# Patient Record
Sex: Male | Born: 1960 | Race: White | Hispanic: No | Smoking: Former smoker
Health system: Southern US, Community
[De-identification: ages and names within clinical notes are randomized; demographics above are authoritative.]

## PROBLEM LIST (undated history)

## (undated) DIAGNOSIS — I209 Angina pectoris, unspecified: Secondary | ICD-10-CM

## (undated) DIAGNOSIS — Z87442 Personal history of urinary calculi: Secondary | ICD-10-CM

## (undated) DIAGNOSIS — I10 Essential (primary) hypertension: Secondary | ICD-10-CM

## (undated) HISTORY — PX: CERVICAL DISC SURGERY: SHX588

## (undated) HISTORY — PX: DG GALL BLADDER: HXRAD326

## (undated) HISTORY — DX: Essential (primary) hypertension: I10

## (undated) HISTORY — PX: NECK SURGERY: SHX720

---

## 2006-09-28 HISTORY — PX: NECK SURGERY: SHX720

## 2009-09-04 ENCOUNTER — Emergency Department (HOSPITAL_COMMUNITY): Admission: EM | Admit: 2009-09-04 | Discharge: 2009-09-04 | Payer: Self-pay | Admitting: Family Medicine

## 2010-12-08 ENCOUNTER — Observation Stay (HOSPITAL_COMMUNITY)
Admission: EM | Admit: 2010-12-08 | Discharge: 2010-12-10 | Disposition: A | Discharge status: Home / Self Care | Payer: BC Managed Care – PPO | Attending: Internal Medicine | Admitting: Internal Medicine

## 2010-12-08 ENCOUNTER — Emergency Department (HOSPITAL_COMMUNITY): Payer: BC Managed Care – PPO

## 2010-12-08 DIAGNOSIS — F17200 Nicotine dependence, unspecified, uncomplicated: Secondary | ICD-10-CM | POA: Insufficient documentation

## 2010-12-08 DIAGNOSIS — R0789 Other chest pain: Principal | ICD-10-CM | POA: Insufficient documentation

## 2010-12-08 LAB — CBC
HCT: 42.4 % (ref 39.0–52.0)
Hemoglobin: 14 g/dL (ref 13.0–17.0)
RBC: 4.7 MIL/uL (ref 4.22–5.81)
WBC: 10.8 10*3/uL — ABNORMAL HIGH (ref 4.0–10.5)

## 2010-12-08 LAB — DIFFERENTIAL
Basophils Absolute: 0 10*3/uL (ref 0.0–0.1)
Basophils Relative: 0 % (ref 0–1)
Lymphocytes Relative: 32 % (ref 12–46)
Neutro Abs: 6.6 10*3/uL (ref 1.7–7.7)
Neutrophils Relative %: 61 % (ref 43–77)

## 2010-12-08 LAB — BASIC METABOLIC PANEL
CO2: 27 mEq/L (ref 19–32)
Calcium: 8.9 mg/dL (ref 8.4–10.5)
Chloride: 104 mEq/L (ref 96–112)
GFR calc Af Amer: >60 mL/min (ref >60)
Glucose, Bld: 99 mg/dL (ref 70–99)
Potassium: 3.5 mEq/L (ref 3.5–5.1)
Sodium: 138 mEq/L (ref 135–145)

## 2010-12-08 LAB — TROPONIN I: Troponin I: 0.01 ng/mL (ref 0.00–0.06)

## 2010-12-08 LAB — CK TOTAL AND CKMB (NOT AT ARMC)
CK, MB: 2.1 ng/mL (ref 0.3–4.0)
Total CK: 96 U/L (ref 7–232)

## 2010-12-09 DIAGNOSIS — R079 Chest pain, unspecified: Secondary | ICD-10-CM

## 2010-12-09 LAB — CARDIAC PANEL(CRET KIN+CKTOT+MB+TROPI)
Total CK: 82 U/L (ref 7–232)
Total CK: 83 U/L (ref 7–232)
Troponin I: 0.01 ng/mL (ref 0.00–0.06)
Troponin I: 0.01 ng/mL (ref 0.00–0.06)

## 2010-12-09 LAB — BASIC METABOLIC PANEL
Calcium: 8.9 mg/dL (ref 8.4–10.5)
GFR calc Af Amer: >60 mL/min (ref >60)
GFR calc non Af Amer: >60 mL/min (ref >60)
Glucose, Bld: 110 mg/dL — ABNORMAL HIGH (ref 70–99)
Sodium: 139 mEq/L (ref 135–145)

## 2010-12-09 LAB — LIPID PANEL
LDL Cholesterol: 117 mg/dL — ABNORMAL HIGH (ref 0–99)
Total CHOL/HDL Ratio: 4.9 RATIO
VLDL: 14 mg/dL (ref 0–40)

## 2010-12-10 ENCOUNTER — Telehealth (INDEPENDENT_AMBULATORY_CARE_PROVIDER_SITE_OTHER): Payer: Self-pay | Admitting: *Deleted

## 2010-12-10 LAB — CBC
Hemoglobin: 14.3 g/dL (ref 13.0–17.0)
MCH: 29.7 pg (ref 26.0–34.0)
MCV: 91.7 fL (ref 78.0–100.0)
Platelets: 181 10*3/uL (ref 150–400)
RBC: 4.82 MIL/uL (ref 4.22–5.81)

## 2010-12-10 LAB — BASIC METABOLIC PANEL
BUN: 10 mg/dL (ref 6–23)
CO2: 27 mEq/L (ref 19–32)
Chloride: 107 mEq/L (ref 96–112)
Creatinine, Ser: 1.03 mg/dL (ref 0.4–1.5)

## 2010-12-10 NOTE — H&P (Signed)
NAMEKOSTA, SCHNITZLER NO.:  [id number]  MEDICAL RECORD NO.:  [id number]           PATIENT TYPE:  E  LOCATION:  WLED                         FACILITY:  [facility]  PHYSICIAN:  Vania Rea, M.D. DATE OF BIRTH:  1960/12/22  DATE OF ADMISSION:  12/08/2010 DATE OF DISCHARGE:                             HISTORY & PHYSICAL   PRIMARY CARE PHYSICIAN:  Unassigned.  CHIEF COMPLAINT:  Chest pain.  HISTORY OF PRESENT ILLNESS:  This is a 50 year old Caucasian gentleman who smokes a pack a day, but denies any other medical issues and works as a Designer, industrial/product, does a lot of walking throughout the day, but does not have regular exercise program.  The patient says he was in his baseline state of health until about 4:30 this afternoon when he had sudden onset of a sharp left-sided chest pain associated with sweating. He had no nausea or vomiting.  No lightheadedness or syncope. Eventually, the patient came to the emergency room by private vehicle arriving just after 6 p.m., received sublingual nitroglycerin and the pain went from 5/10 to 1/10.  As a result, the hospitalist service was called to assist with management.  The patient has had no orthopnea.  No lower extremity edema.  No dyspnea on exertion.  He has no family medical history of coronary artery disease.  PAST MEDICAL HISTORY:  None.  MEDICATIONS:  None.  ALLERGIES:  No known drug allergies.  SOCIAL HISTORY:  Smokes 1 pack per day since teenager.  Denies alcohol or illicit drug use.  Works as a Designer, industrial/product.  FAMILY HISTORY:  Significant for hypertension and diabetes.  REVIEW OF SYSTEMS:  Other than noted above, unremarkable.  PHYSICAL EXAMINATION:  GENERAL:  Athletically built middle-aged Caucasian gentleman reclining in the stretcher, not in any acute distress. VITAL SIGNS:  Temperature was 98.2, pulse 74, respirations 16, blood pressure 110/59.  He is saturating at 96% on room air. HEENT:  His  pupils are round and equal.  Mucous membranes pink and anicteric. NECK:  No cervical lymphadenopathy or thyromegaly.  No carotid bruit. No jugular venous distention. CHEST:  Clear to auscultation bilaterally. CARDIOVASCULAR:  Regular rhythm.  No murmurs. ABDOMEN:  Scaphoid, soft, nontender.  No masses. EXTREMITIES:  Without edema. SKIN:  Warm and dry.  No ulcerations. CENTRAL NERVOUS SYSTEM:  Cranial nerves II through XII are grossly intact.  He has no focal lateralizing signs.  LABORATORY DATA:  His white count is 10.8, hemoglobin 14.0, platelets 198.  He has a normal differential.  His sodium is 138, potassium 3.5, chloride 104, CO2 is 27, glucose 99, BUN 8, creatinine 0.89.  His calcium is 8.9.  His cardiac enzymes are completely normal with a troponin of 0.01, total CK of 96, CK-MB 2.1.  His 2-view chest x-ray shows no acute abnormalities.  His EKG shows normal sinus rhythm.  No ST- segment changes such as ischemia.  ASSESSMENT: 1. Unstable angina as evidenced by chest pain, relieved by     nitroglycerin. 2. Tobacco abuse.  PLAN: 1. We will bring this gentleman on observation to rule out an acute     coronary  syndrome.  I have offered him smoking cessation therapy     and he is willing to try it, although he said he has failed patches     previously. 2. I have counseled him on the importance of having a regular medical     physician for preventative screening exams. 3. Other plans as per orders.     Vania Rea, M.D.     LC/MEDQ  D:  12/08/2010  T:  12/08/2010  Job:  [id number]  Electronically Signed by Vania Rea M.D. on 12/10/2010 02:58:46 AM

## 2010-12-10 NOTE — Consult Note (Addendum)
Benjamin Hardy, Benjamin Hardy               ACCOUNT NO.:  [id number]  MEDICAL RECORD NO.:  [id number]           PATIENT TYPE:  I  LOCATION:  1433                         FACILITY:  [facility]  PHYSICIAN:  Madolyn Frieze. Benjamin Som, MD, FACCDATE OF BIRTH:  [birth year]  DATE OF CONSULTATION:  12/09/2010 DATE OF DISCHARGE:                                CONSULTATION   PRIMARY CARDIOLOGIST:  New.  PRIMARY MEDICAL DOCTOR:  None.  CHIEF COMPLAINT:  Chest pain.  HISTORY OF PRESENT ILLNESS:  Benjamin Hardy is a 50 year old gentleman with no significant past medical history and no prior cardiac history or workup who does have 35-year history of ongoing tobacco abuse.  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  Early on in the shift, he felt fine, without chest pain or shortness of breath.  However, at approximately 5:00 p.m. while walking down steps, he developed left- sided anterior chest discomfort which he describes as sudden and sharp pain.  He walked on a few more steps and stopped and then eased off.  He felt what he describes as "clammy headed."  He went downstairs, sat on a patio table and smoked cigarettes.  Pain overall lasted 45 minutes.  He was reluctant to come to the hospital but the owner of the company insisted that he proceed to the ER.  He was given a sublingual nitroglycerin which he states relieved the pain after about 20 minutes. He has never had any chest pain before.  He does get mild dyspnea with stairs.  He has had no chest pain or shortness of breath with admission, and denies any shortness of breath with this particular episode.  Pain is not increased with inspiration.  He denies any recent surgery but did travel recently to [place] by 6-hour car ride, therein back.  Cardiac enzymes are negative x3 and EKG shows nonspecific ST changes with a PVC and right axis deviation.  PAST MEDICAL HISTORY:  None.  PAST  SURGICAL HISTORY:  Cervical disk disease, status post plate implantation in his neck 2 years ago.  The patient denies any prior history of MI, CHF, diabetes, kidney disease, echocardiogram, cath or stress test.  MEDICATIONS:  As an outpatient, the patient is taking no medicines. Inpatient, he has been started on aspirin 81 mg daily, Lovenox 40 mg daily, Lopid 600 mg b.i.d., nitroglycerin 1 inch daily, and nicotine patch.  ALLERGIES:  No known drug allergies.  SOCIAL HISTORY:  Benjamin Hardy is married and lives with his wife.  They do not have any children.  He has smoked 1 pack per day for 35 years.  He endorses drinking 3 to 4 beers may be once a month or less.  He denies any illicit drug use.  He works for [employer] as a Designer, industrial/product. He does a lot of walking in his work with no chest pain or shortness of breath but does not do dedicated exercise regularly.  FAMILY HISTORY:  His mother is living, age 71, with no health problems. Father is living, age 55,  with no health problems.  He has 6 siblings, all of whom are in good health with no known health problems.  REVIEW OF SYSTEMS:  No fevers, chills, edema, palpitations, syncope, claudication, nausea, vomiting, melena or hematemesis.  See HPI for pertinent positives.  All other systems reviewed and otherwise negative.  LABORATORY DATA:  WBC 10.8, hemoglobin 14, hematocrit 42.4, platelet count 198.  Sodium 139, potassium 4.6, chloride 105, CO2 of 29, glucose 110, BUN 9, creatinine 1.01.  Cardiac enzymes negative x2.  Total cholesterol 165, triglycerides 70, HDL 34, LDL 117.  RADIOLOGY:  Chest x-ray, December 08, 2010, showed no acute abnormalities.  PHYSICAL EXAMINATION:  VITAL SIGNS:  Temperature 97.9, pulse 58, respirations 18, blood pressure 108/71, pulse ox 98% on room air. GENERAL:  [name], in no acute distress, laying comfortably flat in bed. HEENT:  Normocephalic, atraumatic with  extraocular movements intact. Anicteric sclerae.  Nares without discharge. NECK:  Supple without carotid bruit on auscultation. HEART:  Reveals regular rate and rhythm with S1 and S2 without murmurs, rubs or gallops. LUNGS:  Lungs have faint rhonchorous sounds on expiration but are otherwise free of wheezes or rales.  Good air movement throughout. ABDOMEN:  Soft, nontender, nondistended.  Positive bowel sounds.  No hepatosplenomegaly. EXTREMITIES:  Warm and dry without edema.  He has 2+ pedal pulses bilaterally. NEUROLOGIC:  He is alert and oriented x3.  Responds to questions appropriately with a normal affect.  ASSESSMENT AND PLAN:  The patient was seen and examined by Dr. Jens Hardy and myself.  Benjamin Hardy is a 50 year old gentleman with past medical history only pertinent for ongoing tobacco abuse for 35 years with no prior history of hypertension, diabetes, hyperlipidemia or family history of coronary artery disease.  He presents with an episode of somewhat atypical chest pain yesterday while walking stairs, located in the left upper chest without radiation, not pleuritic or positional, which he describes as a sharp and stabbing sensation.  It was associated with some diaphoresis but no shortness of breath or nausea.  Duration was 45 minutes and it resolved with nitroglycerin, however, only after 20 minutes had passed.  His enzymes are negative and his EKG is nonspecific for ST changes with right axis deviation and PVC.  At this time, we will plan an outpatient Myoview given the atypical nature of his symptoms and this will be arranged early for in 2 days and he will follow with Dr. Jens Hardy thereafter.  Of note, he does have the right axis deviation in his EKG as well as recent travel to [place], so we will go ahead and check a D- dimer prior to discharge.  If it is positive, we will plan for CT angio but if it is negative, we feel he can be discharged home.  He has also been  extensively counseled on his tobacco abuse.  Thank you for the opportunity to participate in the care of this patient.     Dayna Dunn, P.A.C.   ______________________________ Madolyn Frieze. Benjamin Som, MD, [facility]    DD/MEDQ  D:  12/09/2010  T:  12/09/2010  Job:  [id number]  cc:   Madolyn Frieze. Benjamin Som, MD, [facility] [address]  [address] [place] [address]  Electronically Signed by Ronie Spies  on 12/10/2010 10:16:05 PM Electronically Signed by Olga Millers MD [facility] on 12/14/2010 08:24:17 AM

## 2010-12-11 ENCOUNTER — Ambulatory Visit (HOSPITAL_COMMUNITY): Payer: BC Managed Care – PPO | Attending: Cardiology

## 2010-12-11 ENCOUNTER — Encounter: Payer: Self-pay | Admitting: Internal Medicine

## 2010-12-11 DIAGNOSIS — R0989 Other specified symptoms and signs involving the circulatory and respiratory systems: Secondary | ICD-10-CM

## 2010-12-11 DIAGNOSIS — R079 Chest pain, unspecified: Secondary | ICD-10-CM

## 2010-12-11 DIAGNOSIS — I491 Atrial premature depolarization: Secondary | ICD-10-CM

## 2010-12-11 DIAGNOSIS — I4949 Other premature depolarization: Secondary | ICD-10-CM

## 2010-12-16 NOTE — Progress Notes (Signed)
Summary: Nuclear Pre-Procedure  Phone Note Outgoing Call Call back at [facility] Phone [phone]   Call placed by: Stanton Kidney, EMT-P,  December 10, 2010 11:27 AM Action Taken: Phone Call Completed Summary of Call: Reviewed information on Myoview Information Sheet (see scanned document for further details).  Spoke with the patient's wife. Stanton Kidney, EMT-P  December 10, 2010 11:27 AM      Nuclear Med Background Indications for Stress Test: Evaluation for Ischemia, Post Hospital  Indications Comments: 12/10/10 CP, (-) enzymes  History: Echo  History Comments: Echo: EF=55-65%, NL  Symptoms: Chest Pain, Chest Pain with Exertion, Diaphoresis, DOE    Nuclear Pre-Procedure Cardiac Risk Factors: Smoker

## 2010-12-16 NOTE — Assessment & Plan Note (Signed)
Summary: Cardiology Nuclear Testing  Nuclear Med Background Indications for Stress Test: Evaluation for Ischemia, Post Hospital  Indications Comments: 12/10/10 CP, (-) enzymes  History: Echo  History Comments: Echo: EF=55-65%, NL  Symptoms: Chest Pain, Chest Pain with Exertion, Diaphoresis, DOE, Fatigue, Palpitations    Nuclear Pre-Procedure Cardiac Risk Factors: Smoker Caffeine/Decaff Intake: None NPO After: 4:00 PM Lungs: clear IV 0.9% NS with Angio Cath: 20g     IV Site: R Hand IV Started by: Bonnita Levan, RN Chest Size (in) 44     Height (in): 72 Weight (lb): 180 BMI: 24.50  Nuclear Med Study 1 or 2 day study:  1 day     Stress Test Type:  Stress Reading MD:  Dietrich Pates, MD     Referring MD:  B.Crenshaw Resting Radionuclide:  Technetium 87m Tetrofosmin     Resting Radionuclide Dose:  11 mCi  Stress Radionuclide:  Technetium 65m Tetrofosmin     Stress Radionuclide Dose:  33 mCi   Stress Protocol Exercise Time (min):  10:30 min     Max HR:  155 bpm     Predicted Max HR:  170 bpm  Max Systolic BP: 192 mm Hg     Percent Max HR:  91.18 %     METS: 12.6 Rate Pressure Product:  16109    Stress Test Technologist:  Milana Na, EMT-P     Nuclear Technologist:  Domenic Polite, CNMT  Rest Procedure  Myocardial perfusion imaging was performed at rest 45 minutes following the intravenous administration of Technetium 45m Tetrofosmin.  Stress Procedure  The patient exercised for 10:30. The patient stopped due to fatigue, leg pain, and denied any chest pain.  There were no significant ST-T wave changes and occ pvcs/pacs.  Technetium 44m Tetrofosmin was injected at peak exercise and myocardial perfusion imaging was performed after a brief delay.  QPS Raw Data Images:  Images were motion corrected.  Soft tissue (diaphragm) underlies heart Stress Images:  Normal homogeneous uptake in all areas of the myocardium. Rest Images:  Normal homogeneous uptake in all areas of the  myocardium. Subtraction (SDS):  No evidence of ischemia. Transient Ischemic Dilatation:  .99  (Normal <1.22)  Lung/Heart Ratio:  .31  (Normal <0.45)  Quantitative Gated Spect Images QGS EDV:  125 ml QGS ESV:  55 ml QGS EF:  56 %   Overall Impression  Exercise Capacity: Excellent exercise capacity. BP Response: Normal blood pressure response. Clinical Symptoms: No chest pain ECG Impression: No significant ST segment change suggestive of ischemia. Overall Impression: Normal stress nuclear study.  Appended Document: Cardiology Nuclear Testing ok  Appended Document: Cardiology Nuclear Testing pt aware of results

## 2010-12-23 NOTE — Discharge Summary (Signed)
  NAMEJAMERIUS, Benjamin Hardy               ACCOUNT NO.:  [id number]  MEDICAL RECORD NO.:  [id number]           PATIENT TYPE:  I  LOCATION:  1433                         [facility]ACILITY[facility]  PHYSICIAN:  Hartley Barefoot, MD    DATE OF BIRTH:  Feb 26, 1961  DATE OF ADMISSION:  12/08/2010 DATE OF DISCHARGE:  12/10/2010                              DISCHARGE SUMMARY   DISCHARGE DIAGNOSES: 1. Atypical chest pain, rule out acute coronary syndrome. 2. Past medical history of tobacco use.  DISCHARGE MEDICATIONS:  Aspirin 81 mg p.o. daily.  STUDIES PERFORMED: 1. A 2-D echo showed ejection fraction of 55-65%.  No wall motion     abnormality. 2. Chest x-ray, no acute abnormalities.  HISTORY OF PRESENT ILLNESS:  A 50 year old who presents complaining of chest pain to anterior chest, some discomfort, sudden onset, sharp in quality.  The episode lasted for 45 minutes.  The patient received sublingual nitroglycerin which he states improved his pain.  EKG showed nonspecific ST changes with PVC and right axis deviation.  CONSULTANT:  Cardiology was consulted.  HOSPITAL COURSE:  Atypical chest pain, rule out acute coronary syndrome. EKG with nonspecific ST changes and right axis deviation.  Cardiac enzymes x3 negative.  D-dimer negative, unlikely PE.  A 2-D echocardiogram, no wall motion abnormality.  The patient will have a stress test arranged by Cardiology on December 11, 2010, and 7:30 a.m. Chest pain has resolved at this time.  The patient will be discharged today.  Chest x-ray negative for pneumonia or pneumothorax.  LDL was at 117.  The patient should follow a heart healthy diet.  On the day of discharge, the patient was in improved condition.  No chest pain.  Blood pressure 138/91, sat 97% on room air, respirations 20, pulse 74, and temperature 98.1.  DISCHARGE LABS:  Sodium 141, potassium 4.2, chloride 107, bicarb 27, glucose 91, BUN 10, creatinine 1.03, white blood cell 8.3, hemoglobin 14.3, and  platelet 181,000.     Hartley Barefoot, MD     BR/MEDQ  D:  12/10/2010  T:  12/10/2010  Job:  [id number]  Electronically Signed by Hartley Barefoot MD on 12/23/2010 01:13:53 PM

## 2010-12-30 LAB — CBC
HCT: 42.2 % (ref 39.0–52.0)
Hemoglobin: 13.9 g/dL (ref 13.0–17.0)
MCHC: 32.9 g/dL (ref 30.0–36.0)
MCV: 92.4 fL (ref 78.0–100.0)
RBC: 4.57 MIL/uL (ref 4.22–5.81)

## 2010-12-30 LAB — COMPREHENSIVE METABOLIC PANEL
ALT: 14 U/L (ref 0–53)
CO2: 29 mEq/L (ref 19–32)
Calcium: 9.5 mg/dL (ref 8.4–10.5)
Creatinine, Ser: 1 mg/dL (ref 0.4–1.5)
GFR calc non Af Amer: >60 mL/min (ref >60)
Glucose, Bld: 96 mg/dL (ref 70–99)

## 2010-12-30 LAB — DIFFERENTIAL
Eosinophils Absolute: 0.2 10*3/uL (ref 0.0–0.7)
Lymphocytes Relative: 38 % (ref 12–46)
Lymphs Abs: 3.5 10*3/uL (ref 0.7–4.0)
Neutrophils Relative %: 52 % (ref 43–77)

## 2011-01-13 ENCOUNTER — Encounter: Payer: Self-pay | Admitting: Cardiology

## 2011-01-14 ENCOUNTER — Encounter: Payer: BC Managed Care – PPO | Admitting: Cardiology

## 2011-01-14 NOTE — Progress Notes (Signed)
HPI: Pleasant gentleman recently admitted to [facility] in March of 2012 with atypical chest pain. Cardiac markers and d-dimer negative. Echocardiogram showed normal LV function. There was mild left atrial enlargement. There was moderate right ventricular enlargement with decreased function. There was moderate to severe right atrial enlargement. Outpatient Myoview in March of 2012 revealed normal perfusion and an ejection fraction of 56%. Since he was discharged,   Current Outpatient Prescriptions  Medication Sig Dispense Refill  . aspirin 81 MG tablet Take 81 mg by mouth daily.           Past Medical History  Diagnosis Date  . Chest pain     Past Surgical History  Procedure Date  . Cervical disc surgery   . Neck surgery     History   Social History  . Marital Status: Married    Spouse Name: N/A    Number of Children: N/A  . Years of Education: N/A   Occupational History  . warehouse -- [employer]    Social History Main Topics  . Smoking status: Current Everyday Smoker -- 1.0 packs/day for 35 years  . Smokeless tobacco: Not on file  . Alcohol Use: Yes     3-4 beers a month  . Drug Use: No  . Sexually Active: Not on file   Other Topics Concern  . Not on file   Social History Narrative  . No narrative on file    ROS: no fevers or chills, productive cough, hemoptysis, dysphasia, odynophagia, melena, hematochezia, dysuria, hematuria, rash, seizure activity, orthopnea, PND, pedal edema, claudication. Remaining systems are negative.  Physical Exam: Well-developed well-nourished in no acute distress.  Skin is warm and dry.  HEENT is normal.  Neck is supple. No thyromegaly.  Chest is clear to auscultation with normal expansion.  Cardiovascular exam is regular rate and rhythm.  Abdominal exam nontender or distended. No masses palpated. Extremities show no edema. neuro grossly intact  ECG     This encounter was created in error - please disregard.

## 2011-02-03 ENCOUNTER — Encounter: Payer: Self-pay | Admitting: Cardiology

## 2011-02-26 ENCOUNTER — Encounter: Payer: Self-pay | Admitting: Cardiology

## 2011-11-09 ENCOUNTER — Emergency Department (HOSPITAL_COMMUNITY): Payer: BC Managed Care – PPO

## 2011-11-09 ENCOUNTER — Encounter (HOSPITAL_COMMUNITY): Payer: Self-pay | Admitting: Emergency Medicine

## 2011-11-09 ENCOUNTER — Emergency Department (HOSPITAL_COMMUNITY)
Admission: EM | Admit: 2011-11-09 | Discharge: 2011-11-10 | Disposition: A | Discharge status: Home / Self Care | Payer: BC Managed Care – PPO | Attending: Emergency Medicine | Admitting: Emergency Medicine

## 2011-11-09 ENCOUNTER — Other Ambulatory Visit: Payer: Self-pay

## 2011-11-09 DIAGNOSIS — R079 Chest pain, unspecified: Secondary | ICD-10-CM | POA: Insufficient documentation

## 2011-11-09 DIAGNOSIS — R109 Unspecified abdominal pain: Secondary | ICD-10-CM | POA: Insufficient documentation

## 2011-11-09 DIAGNOSIS — K859 Acute pancreatitis without necrosis or infection, unspecified: Secondary | ICD-10-CM | POA: Insufficient documentation

## 2011-11-09 DIAGNOSIS — F17200 Nicotine dependence, unspecified, uncomplicated: Secondary | ICD-10-CM | POA: Insufficient documentation

## 2011-11-09 DIAGNOSIS — R10819 Abdominal tenderness, unspecified site: Secondary | ICD-10-CM | POA: Insufficient documentation

## 2011-11-09 LAB — CBC
MCV: 89.6 fL (ref 78.0–100.0)
Platelets: 225 10*3/uL (ref 150–400)
RBC: 4.98 MIL/uL (ref 4.22–5.81)
WBC: 18.7 10*3/uL — ABNORMAL HIGH (ref 4.0–10.5)

## 2011-11-09 LAB — LIPASE, BLOOD: Lipase: >3000 U/L — ABNORMAL HIGH (ref 11–59)

## 2011-11-09 LAB — COMPREHENSIVE METABOLIC PANEL
Alkaline Phosphatase: 90 U/L (ref 39–117)
BUN: 12 mg/dL (ref 6–23)
Chloride: 102 mEq/L (ref 96–112)
Creatinine, Ser: 1.03 mg/dL (ref 0.50–1.35)
GFR calc Af Amer: >90 mL/min (ref >90)
Glucose, Bld: 165 mg/dL — ABNORMAL HIGH (ref 70–99)
Potassium: 3.6 mEq/L (ref 3.5–5.1)
Total Bilirubin: 0.5 mg/dL (ref 0.3–1.2)
Total Protein: 7.7 g/dL (ref 6.0–8.3)

## 2011-11-09 LAB — URINALYSIS, ROUTINE W REFLEX MICROSCOPIC
Bilirubin Urine: NEGATIVE
Ketones, ur: NEGATIVE mg/dL
Nitrite: NEGATIVE
Urobilinogen, UA: 0.2 mg/dL (ref 0.0–1.0)

## 2011-11-09 LAB — DIFFERENTIAL
Basophils Relative: 0 % (ref 0–1)
Eosinophils Absolute: 0 10*3/uL (ref 0.0–0.7)
Lymphocytes Relative: 7 % — ABNORMAL LOW (ref 12–46)
Lymphs Abs: 1.3 10*3/uL (ref 0.7–4.0)
Neutro Abs: 16.3 10*3/uL — ABNORMAL HIGH (ref 1.7–7.7)

## 2011-11-09 MED ORDER — HYDROMORPHONE HCL PF 2 MG/ML IJ SOLN
2.0000 mg | Freq: Once | INTRAMUSCULAR | Status: AC
  Administered 2011-11-09: 1 mg via INTRAVENOUS
  Filled 2011-11-09: qty 1

## 2011-11-09 MED ORDER — GI COCKTAIL ~~LOC~~
30.0000 mL | Freq: Once | ORAL | Status: AC
  Administered 2011-11-09: 30 mL via ORAL
  Filled 2011-11-09: qty 30

## 2011-11-09 MED ORDER — ONDANSETRON HCL 4 MG/2ML IJ SOLN
4.0000 mg | Freq: Once | INTRAMUSCULAR | Status: AC
  Administered 2011-11-09: 4 mg via INTRAVENOUS
  Filled 2011-11-09: qty 2

## 2011-11-09 MED ORDER — MORPHINE SULFATE 4 MG/ML IJ SOLN
4.0000 mg | Freq: Once | INTRAMUSCULAR | Status: AC
  Administered 2011-11-09: 4 mg via INTRAVENOUS
  Filled 2011-11-09: qty 1

## 2011-11-09 MED ORDER — IOHEXOL 300 MG/ML  SOLN: 80.0000 mL | Freq: Once | INTRAMUSCULAR | Status: AC | PRN

## 2011-11-09 MED ORDER — HYDROMORPHONE HCL PF 1 MG/ML IJ SOLN
1.0000 mg | Freq: Once | INTRAMUSCULAR | Status: AC
  Administered 2011-11-09: 1 mg via INTRAVENOUS
  Filled 2011-11-09: qty 1

## 2011-11-09 MED ORDER — FAMOTIDINE IN NACL 20-0.9 MG/50ML-% IV SOLN
20.0000 mg | Freq: Once | INTRAVENOUS | Status: AC
  Administered 2011-11-09: 20 mg via INTRAVENOUS
  Filled 2011-11-09: qty 50

## 2011-11-09 NOTE — ED Notes (Signed)
Pt presents with c/o abd pain that started about an hour ago and states it is now radiating up into his chest  Pt states it hurts  Pt states on Wed he had 3 teeth pulled and is currently taking vicodin and amoxicillin  States has not taken any of it today  Pt states the pain started after eating a sandwich at work today  Pt states he had one episode of vomiting earlier today but denies diarrhea  Pt states he become diaphoretic with the pain  Pt states sometimes with breathing it radiates into his back between his shoulder blades

## 2011-11-09 NOTE — ED Notes (Signed)
PA, Santina Evans, at bedside.

## 2011-11-09 NOTE — ED Notes (Signed)
Pt place in triage pt began be clammy

## 2011-11-09 NOTE — ED Provider Notes (Signed)
History     CSN: [id number]  Arrival date & time 11/09/11  1846   First MD Initiated Contact with Patient 11/09/11 2105      Chief Complaint  Patient presents with  . Abdominal Pain    (Consider location/radiation/quality/duration/timing/severity/associated sxs/prior treatment) HPI History provided by pt.   Pt presents w/ c/o diffuse abdominal pain, worst in LLQ and epigastrium since 5:30pm today.  Radiates into chest.  Waxing and waning in severity and seems to feel better in supine position.  Associated w/ N/V.  Most recent BM yesterday am and normal; no hematochezia/melena.  No fever, urinary sx and testicular pain.  Denies cough and SOB.  No PMH and no FH of early MI.  Smokes 1PPD.  Does not drink alcohol and does not take NSAIDs on a regular basis.  Has never had these symptoms in the past.    Past Medical History  Diagnosis Date  . Chest pain     Past Surgical History  Procedure Date  . Cervical disc surgery   . Neck surgery     Family History  Problem Relation Age of Onset  . Hypertension    . Diabetes      History  Substance Use Topics  . Smoking status: Current Everyday Smoker -- 1.0 packs/day for 35 years    Types: Cigarettes  . Smokeless tobacco: Not on file  . Alcohol Use: No      Review of Systems  All other systems reviewed and are negative.    Allergies  Review of patient's allergies indicates no known allergies.  Home Medications   Current Outpatient Rx  Name Route Sig Dispense Refill  . AMOXICILLIN 500 MG PO CAPS Oral Take 500 mg by mouth 3 (three) times daily.    Marland Kitchen HYDROCODONE-ACETAMINOPHEN 5-325 MG PO TABS Oral Take 1 tablet by mouth every 6 (six) hours as needed. For pain    . ASPIRIN 81 MG PO TABS Oral Take 81 mg by mouth daily.        BP 110/69  Pulse 67  Temp(Src) 98.2 F (36.8 C) (Oral)  Resp 20  Wt 170 lb (77.111 kg)  SpO2 98%  Physical Exam  Nursing note and vitals reviewed. Constitutional: He is oriented to person,  place, and time. He appears well-developed and well-nourished. No distress.  HENT:  Head: Normocephalic and atraumatic.  Eyes:       Normal appearance  Neck: Normal range of motion.  Cardiovascular: Normal rate and regular rhythm.   Pulmonary/Chest: Effort normal and breath sounds normal.  Abdominal: Soft. Bowel sounds are normal. He exhibits no distension and no mass. There is no rebound and no guarding.       Mild tenderness mid-line lower abdomen, slightly into LLQ as well as epigastrium.  Reports tenderness worst in epigastrium.    Genitourinary:       Testicles descended bilaterally.  No mass or tenderness.  No rash.  No penile discharge.     Neurological: He is alert and oriented to person, place, and time.  Skin: Skin is warm and dry. No rash noted.  Psychiatric: He has a normal mood and affect. His behavior is normal.    ED Course  Procedures (including critical care time)  Labs Reviewed  CBC - Abnormal; Notable for the following:    WBC 18.7 (*)    All other components within normal limits  DIFFERENTIAL - Abnormal; Notable for the following:    Neutrophils Relative 87 (*)  Lymphocytes Relative 7 (*)    Neutro Abs 16.3 (*)    Monocytes Absolute 1.1 (*)    All other components within normal limits  COMPREHENSIVE METABOLIC PANEL - Abnormal; Notable for the following:    Glucose, Bld 165 (*)    GFR calc non Af Amer 82 (*)    All other components within normal limits  LIPASE, BLOOD - Abnormal; Notable for the following:    Lipase >3000 (*)    All other components within normal limits  URINALYSIS, ROUTINE W REFLEX MICROSCOPIC   Ct Abdomen Pelvis W Contrast  11/09/2011  *RADIOLOGY REPORT*  Clinical Data: Abdominal pain started on hour ago and now radiating to the chest.  Vomiting.  CT ABDOMEN AND PELVIS WITH CONTRAST  Technique:  Multidetector CT imaging of the abdomen and pelvis was performed following the standard protocol during bolus administration of intravenous  contrast.  Contrast:  80 ml Omnipaque-300  Comparison: None.  Findings: Mild dependent atelectasis in the lung bases.  There is peripancreatic fluid and infiltration surrounding the head, body, and tail the pancreas with edema in the mid abdominal mesentery and tracking along the left anterior pararenal fascia and pericolic gutter.  Changes are consistent with acute pancreatitis. Pancreatic parenchyma demonstrates homogeneous enhancement without evidence of pancreatic necrosis.  No soft tissue gas collections. No evidence of vascular complication.  No bile duct dilatation.  The liver, spleen, gallbladder, adrenal glands, abdominal aorta, and retroperitoneal lymph nodes are unremarkable.  Multiple bilateral renal stones, largest in the right mid kidney measuring about 7 mm diameter.  No ureteral stones.  No pyelocaliectasis or ureterectasis.  The stomach and small bowel are not abnormally distended.  No bowel wall thickening is suggested.  Stool filled colon without distension or wall thickening.  No free air in the abdomen.  Pelvis:  The prostate gland is not significantly enlarged but contains calcification.  The bladder wall is not thickened.  No free or loculated pelvic fluid collections.  No inflammatory changes involving the sigmoid colon.  The appendix is not visualized.  Mild degenerative changes in the lumbar spine with normal alignment.  IMPRESSION: Peripancreatic edema and infiltration with fluid in the mesentery and left pericolic gutters consistent with acute pancreatitis.  Original Report Authenticated By: Marlon Pel, M.D.     1. Acute pancreatitis       MDM  51yo M presents w/ c/o abdominal pain w/ radiation into chest + N/V.  Suspect gastritis based on history and exam.  Pt receiving GI cocktail and IV pepcid and zofran.  Will assess pain level again shortly.  Labs pending.    Lipase >3000 and WBC 18.7.  Pt reports no prior h/o pancreatitis.  Again, denies drinking alcohol and has  not had any recent post-prandial N/V or pain nor RUQ tenderness on exam to suggest cholelithiasis.  CT abd/pelvis ordered.  Pt will receive IV morphine for pain.  9:59 PM   Pt has had little pain relief w/ morphine and dilaudid.  VS stable.  2mg  IV dilaudid ordered.  CT still pending.  11:20 PM   CT shows acute pancreatitis and is otherwise unremarkable.  Results discussed w/ pt.  His pain is much improved and he denies nausea currently.  Will po challenge and d/c home w/ percocet, zofran and strict return precautions.  12:28 AM         Otilio Miu, PA 11/10/11 [phone]

## 2011-11-09 NOTE — ED Notes (Signed)
Pt states he had a sharp  Abdominal pain at work. Pt denies pain any where else

## 2011-11-10 MED ORDER — OXYCODONE-ACETAMINOPHEN 5-325 MG PO TABS
1.0000 | ORAL_TABLET | Freq: Once | ORAL | Status: AC
  Administered 2011-11-10: 1 via ORAL
  Filled 2011-11-10: qty 1

## 2011-11-10 MED ORDER — ONDANSETRON HCL 4 MG PO TABS: 4.0000 mg | ORAL_TABLET | Freq: Four times a day (QID) | ORAL | Status: AC

## 2011-11-10 MED ORDER — OXYCODONE-ACETAMINOPHEN 5-325 MG PO TABS: 2.0000 | ORAL_TABLET | ORAL | Status: AC | PRN

## 2011-11-10 NOTE — Discharge Instructions (Signed)
Take percocet as needed for severe pain.  Do not drive within four hours of taking this medication (may cause drowsiness or confusion).  Take zofran as needed for nausea.   Drink clear fluids for at least the next 24 hours and then advance diet gradually.  Please return to the ER if you develop fever, abdominal pain worsens or you have uncontrolled vomiting.  Call [facility] [phone]) if you do not have a primary care doctor and would like assistance with finding one.   Acute Pancreatitis The pancreas is a large gland located behind your stomach. It produces (secretes) enzymes. These enzymes help digest food. It also releases the hormones glucagon and insulin. These hormones help regulate blood sugar. When the pancreas becomes inflamed, the disease is called pancreatitis. Inflammation of the pancreas occurs when enzymes from the pancreas begin attacking and digesting the pancreas. CAUSES  Most cases ofsudden onset (acute) pancreatitis are caused by:  Alcohol abuse.   Gallstones.  Other less common causes are:  Some medications.   Exposure to certain chemicals   Infection.   Damage caused by an accident (trauma).   Surgery of the belly (abdomen).  SYMPTOMS  Acute pancreatitis usually begins with pain in the upper abdomen and may radiate to the back. This pain may last a couple days. The constant pain varies from mild to severe. The acute form of this disease may vary from mild, nonspecific abdominal pain to profound shock with coma. About 1 in 5 cases are severe. These patients become dehydrated and develop low blood pressure. In severe cases, bleeding into the pancreas can lead to shock and death. The lungs, heart, and kidneys may fail. DIAGNOSIS  Your caregiver will form a clinical opinion after giving you an exam. Laboratory work is used to confirm this diagnosis. Often,a digestive enzyme from the pancreas (serum amylase) and other enzymes are elevated. Sugars and fats (lipids) in the  blood may be elevated. There may also be changes in the following levels: calcium, magnesium, potassium, chloride and bicarbonate (chemicals in the blood). X-rays, a CT scan, or ultrasound of your abdomen may be necessary to search for other causes of your abdominal pain. TREATMENT  Most pancreatitis requires treatment of symptoms. Most acute attacks last a couple of days. Your caregiver can discuss the treatment options with you.  If complications occur, hospitalization may be necessary for pain control and intravenous (IV) fluid replacement.   Sometimes, a tube may be put into the stomach to control vomiting.   Food may not be allowed for 3 to 4 days. This gives the pancreas time to rest. Giving the pancreas a rest means there is no stimulation that would produce more enzymes and cause more damage.   Medicines (antibiotics) that kill germs may be given if infection is the cause.   Sometimes, surgery may be required.   Following an acute attack, your caregiver will determine the cause, if possible, and offer suggestions to prevent recurrences.  HOME CARE INSTRUCTIONS   Eat smaller, more frequent meals. This reduces the amount of digestive juices the pancreas produces.   Decrease the amount of fat in your diet. This may help reduce loose, diarrheal stools.   Drink enough water and fluids to keep your urine clear or pale yellow. This is to avoid dehydration which can cause increased pain.   Talk to your caregiver about pain relievers or other medicines that may help.   Avoid anything that may have triggered your pancreatitis (for example, alcohol).  Follow the diet advised by your caregiver. Do not advance the diet too soon.   Take medicines as prescribed.   Get plenty of rest.   Check your blood sugar at home as directed by your caregiver.   If your caregiver has given you a follow-up appointment, it is very important to keep that appointment. Not keeping the appointment could  result in a lasting (chronic) or permanent injury, pain, and disability. If there is any problem keeping the appointment, you must call to reschedule.  SEEK MEDICAL CARE IF:   You are not recovering in the time described by your caregiver.   You have persistent pain, weakness, or feel sick to your stomach (nauseous).   You have recovered and then have another bout of pain.  SEEK IMMEDIATE MEDICAL CARE IF:   You are unable to eat or keep fluids down.   Your pain increases a lot or changes.   You have an oral temperature above 102 F (38.9 C), not controlled by medicine.   Your skin or the white part of your eyes look yellow (jaundice).   You develop vomiting.   You feel dizzy or faint.   Your blood sugar is high (over 300).  MAKE SURE YOU:   Understand these instructions.   Will watch your condition.   Will get help right away if you are not doing well or get worse.  Document Released: 09/14/2005 Document Revised: 05/27/2011 Document Reviewed: 04/27/2008 [facility] Patient Information 2012 [place], [place].

## 2011-11-11 NOTE — ED Provider Notes (Signed)
Medical screening examination/treatment/procedure(s) were performed by non-physician practitioner and as supervising physician I was immediately available for consultation/collaboration.  Shelda Jakes, MD 11/11/11 [phone]

## 2012-02-03 DIAGNOSIS — K8591 Acute pancreatitis with uninfected necrosis, unspecified: Secondary | ICD-10-CM | POA: Insufficient documentation

## 2012-02-03 DIAGNOSIS — R509 Fever, unspecified: Secondary | ICD-10-CM | POA: Insufficient documentation

## 2012-02-03 DIAGNOSIS — K863 Pseudocyst of pancreas: Secondary | ICD-10-CM | POA: Insufficient documentation

## 2012-02-03 DIAGNOSIS — K851 Biliary acute pancreatitis without necrosis or infection: Secondary | ICD-10-CM | POA: Insufficient documentation

## 2012-02-10 DIAGNOSIS — J9811 Atelectasis: Secondary | ICD-10-CM | POA: Insufficient documentation

## 2012-02-10 DIAGNOSIS — Z9049 Acquired absence of other specified parts of digestive tract: Secondary | ICD-10-CM | POA: Insufficient documentation

## 2012-02-10 DIAGNOSIS — K9189 Other postprocedural complications and disorders of digestive system: Secondary | ICD-10-CM | POA: Insufficient documentation

## 2012-02-12 DIAGNOSIS — E876 Hypokalemia: Secondary | ICD-10-CM | POA: Insufficient documentation

## 2012-02-25 DIAGNOSIS — I82890 Acute embolism and thrombosis of other specified veins: Secondary | ICD-10-CM | POA: Insufficient documentation

## 2012-06-30 DIAGNOSIS — I728 Aneurysm of other specified arteries: Secondary | ICD-10-CM | POA: Insufficient documentation

## 2013-09-14 ENCOUNTER — Encounter: Payer: Self-pay | Admitting: Internal Medicine

## 2013-12-22 IMAGING — CT CT ABD-PELV W/ CM
1 of 3 series · 14 of 32 positions shown, 19 images · IV contrast (APPLIED)
Comparison: None.

CLINICAL DATA: Abdominal pain started on hour ago and now radiating
to the chest.  Vomiting.

CT ABDOMEN AND PELVIS WITH CONTRAST
TECHNIQUE: Multidetector CT imaging of the abdomen and pelvis was
performed following the standard protocol during bolus
administration of intravenous contrast.
Contrast:  80 ml Dmnipaque-DSS

[Series 2: abd/pel with · axial · 0.74mm/px · z∈[+1264,+1660]mm · 14 of 89 slices shown, 19 images]
[im 5/89  soft-tissue]
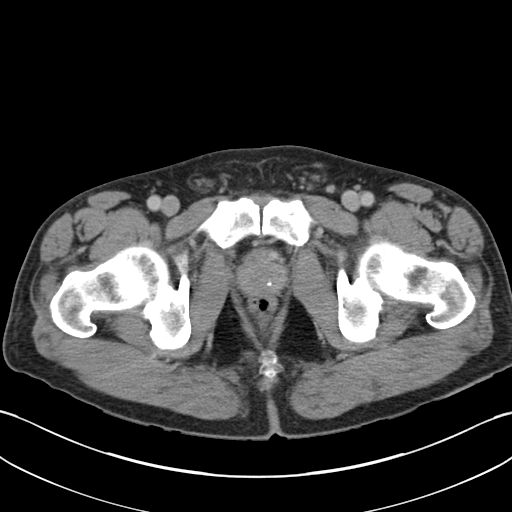
[im 5/89  bone]
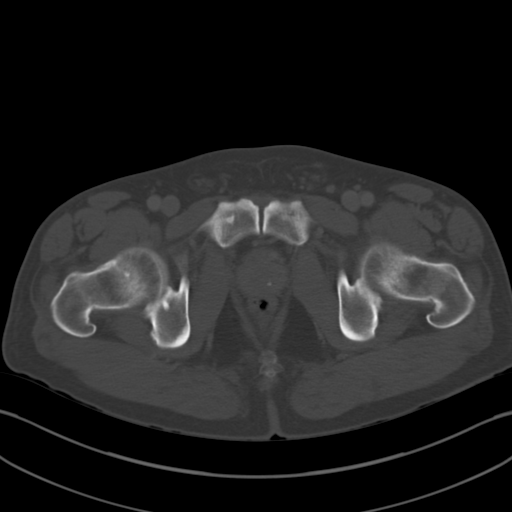
[im 14/89  soft-tissue]
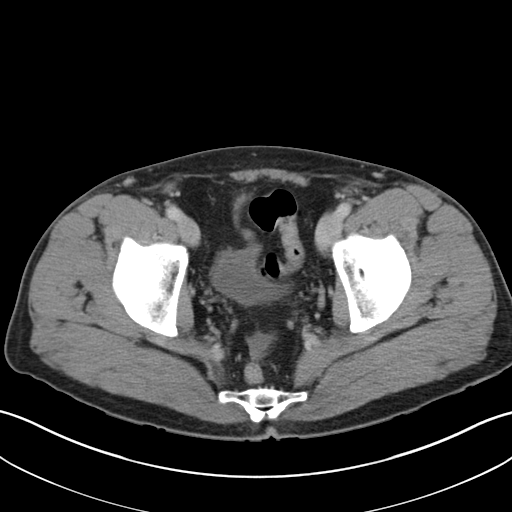
[im 19/89  soft-tissue]
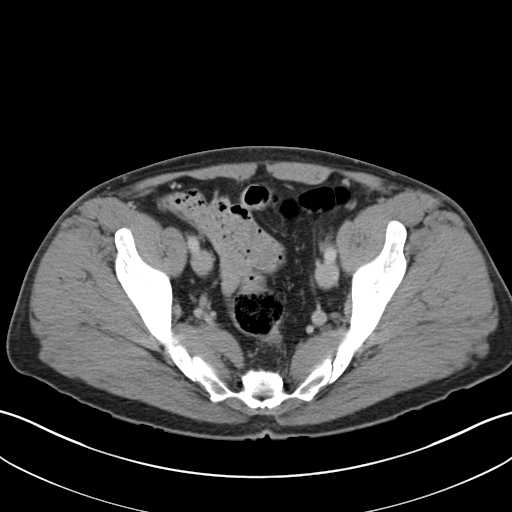
[im 24/89  soft-tissue]
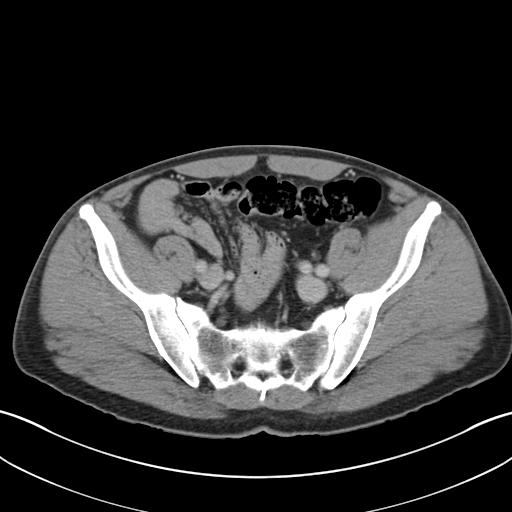
[im 33/89  soft-tissue]
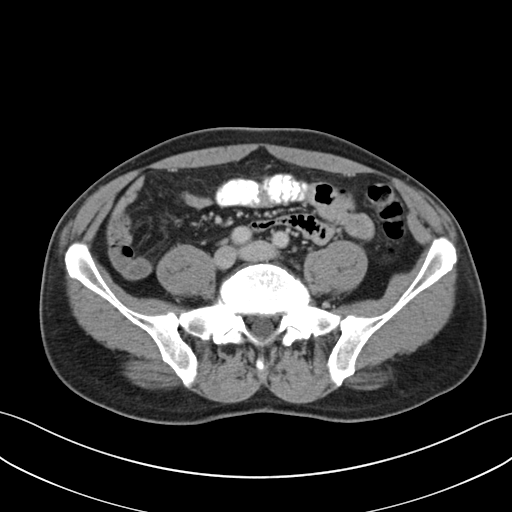
[im 38/89  soft-tissue]
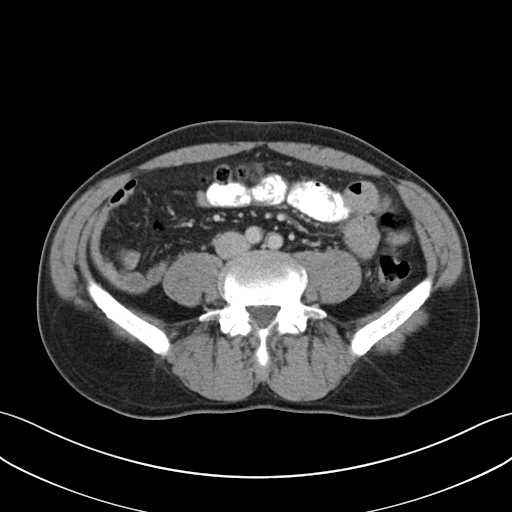
[im 47/89  soft-tissue]
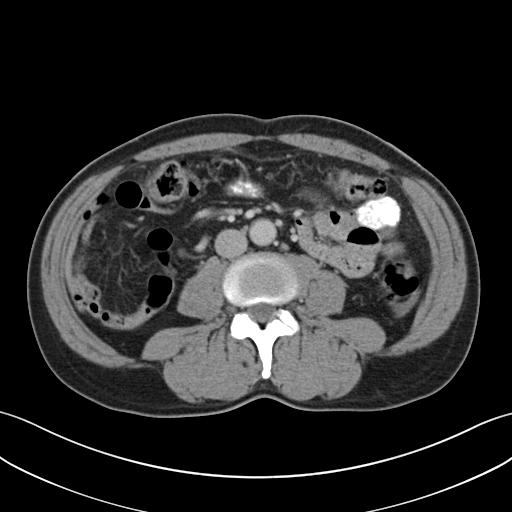
[im 51/89  soft-tissue]
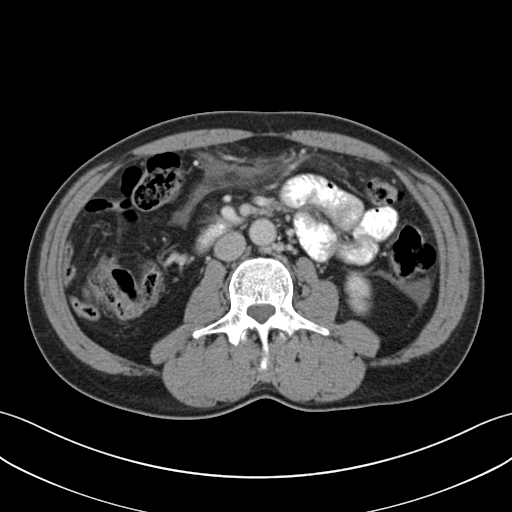
[im 56/89  soft-tissue]
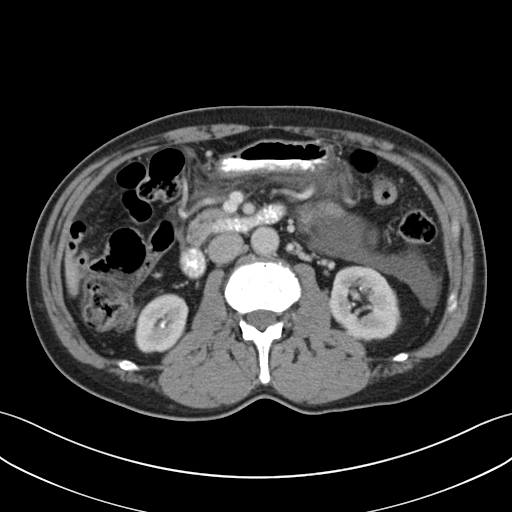
[im 56/89  bone]
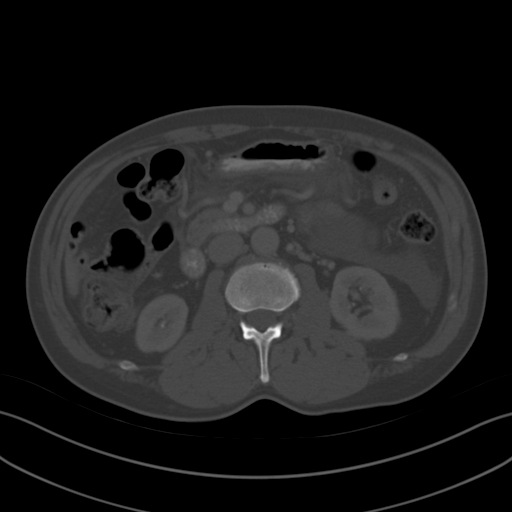
[im 65/89  soft-tissue]
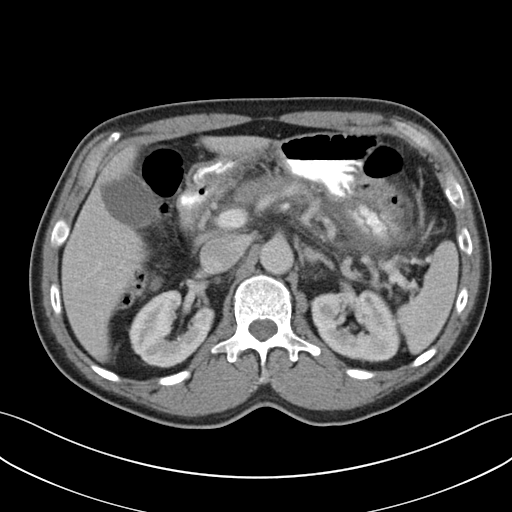
[im 70/89  soft-tissue]
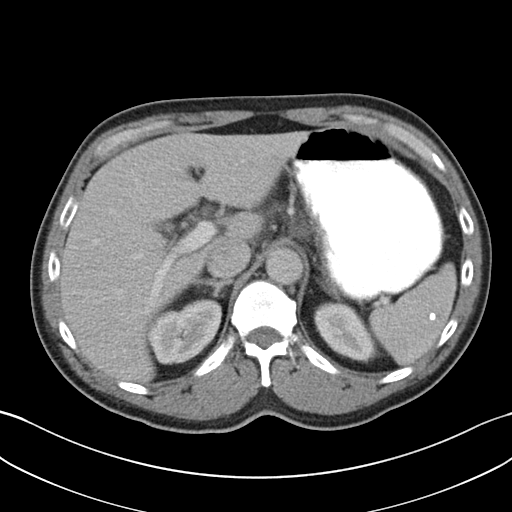
[im 70/89  lung]
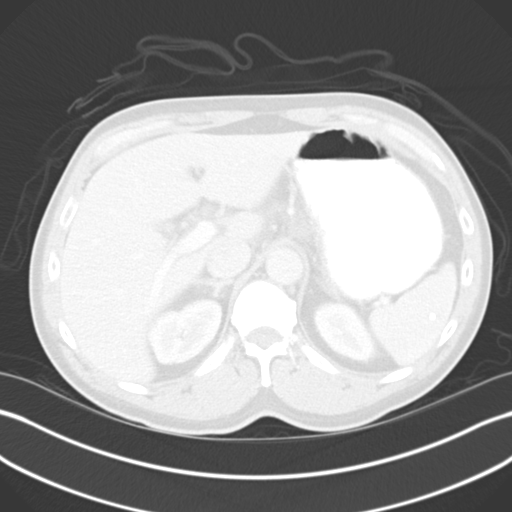
[im 75/89  soft-tissue]
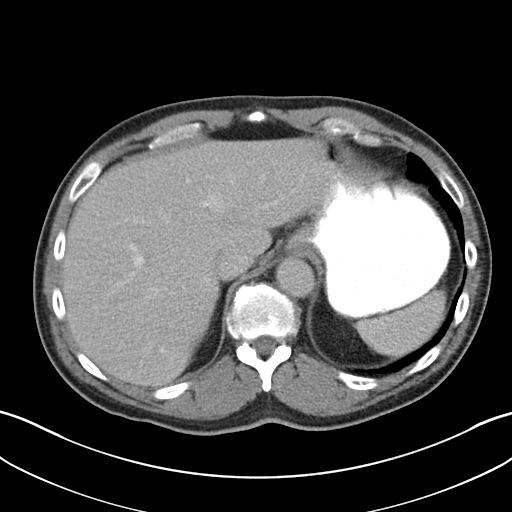
[im 75/89  lung]
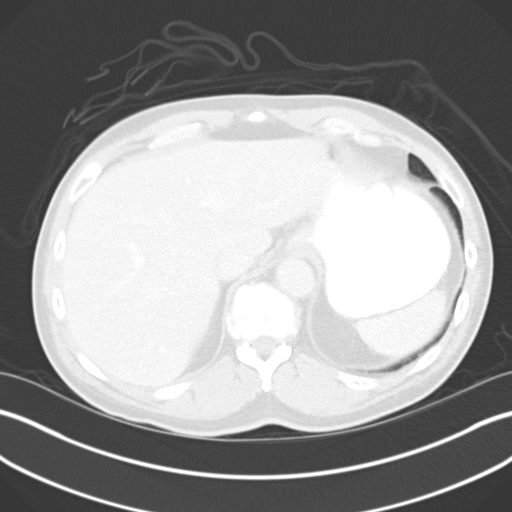
[im 79/89  lung]
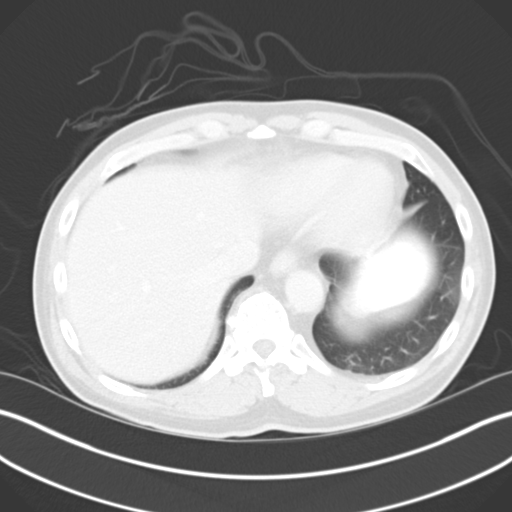
[im 84/89  soft-tissue]
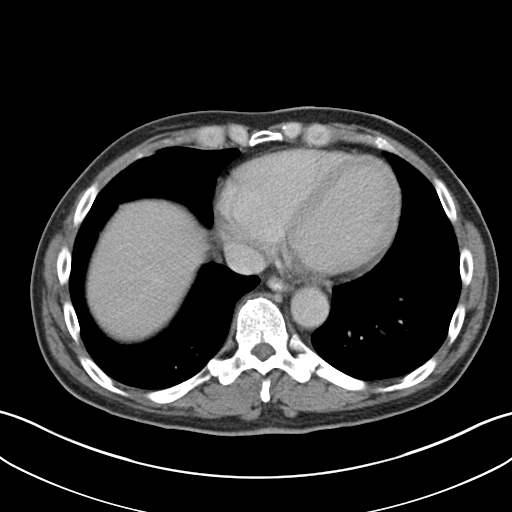
[im 84/89  lung]
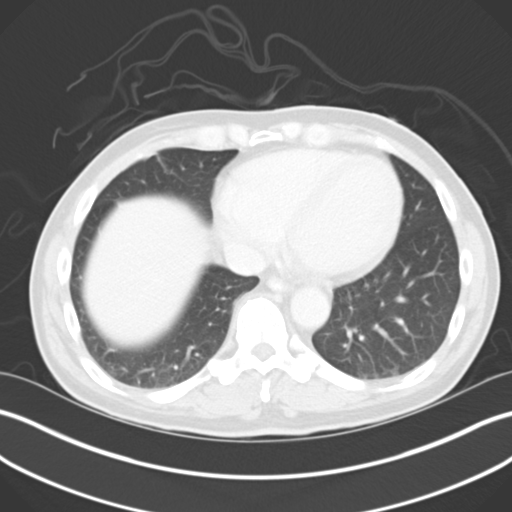

[14 of 32 positions shown; findings below may reference images not displayed]

FINDINGS: Mild dependent atelectasis in the lung bases.

There is peripancreatic fluid and infiltration surrounding the
head, body, and tail the pancreas with edema in the mid abdominal
mesentery and tracking along the left anterior pararenal fascia and
pericolic gutter.  Changes are consistent with acute pancreatitis.
Pancreatic parenchyma demonstrates homogeneous enhancement without
evidence of pancreatic necrosis.  No soft tissue gas collections.
No evidence of vascular complication.  No bile duct dilatation.

The liver, spleen, gallbladder, adrenal glands, abdominal aorta,
and retroperitoneal lymph nodes are unremarkable.  Multiple
bilateral renal stones, largest in the right mid kidney measuring
about 7 mm diameter.  No ureteral stones.  No pyelocaliectasis or
ureterectasis.  The stomach and small bowel are not abnormally
distended.  No bowel wall thickening is suggested.  Stool filled
colon without distension or wall thickening.  No free air in the
abdomen.

Pelvis:  The prostate gland is not significantly enlarged but
contains calcification.  The bladder wall is not thickened.  No
free or loculated pelvic fluid collections.  No inflammatory
changes involving the sigmoid colon.  The appendix is not
visualized.  Mild degenerative changes in the lumbar spine with
normal alignment.
IMPRESSION: Peripancreatic edema and infiltration with fluid in the mesentery
and left pericolic gutters consistent with acute pancreatitis.

## 2016-05-18 ENCOUNTER — Encounter: Payer: Self-pay | Admitting: Emergency Medicine

## 2016-05-18 ENCOUNTER — Emergency Department: Payer: Commercial Managed Care - PPO

## 2016-05-18 ENCOUNTER — Emergency Department
Admission: EM | Admit: 2016-05-18 | Discharge: 2016-05-18 | Disposition: A | Discharge status: Home / Self Care | Payer: Commercial Managed Care - PPO | Attending: Emergency Medicine | Admitting: Emergency Medicine

## 2016-05-18 DIAGNOSIS — Y939 Activity, unspecified: Secondary | ICD-10-CM | POA: Diagnosis not present

## 2016-05-18 DIAGNOSIS — S59911A Unspecified injury of right forearm, initial encounter: Secondary | ICD-10-CM | POA: Diagnosis present

## 2016-05-18 DIAGNOSIS — Y92410 Unspecified street and highway as the place of occurrence of the external cause: Secondary | ICD-10-CM | POA: Diagnosis not present

## 2016-05-18 DIAGNOSIS — F17210 Nicotine dependence, cigarettes, uncomplicated: Secondary | ICD-10-CM | POA: Diagnosis not present

## 2016-05-18 DIAGNOSIS — Y999 Unspecified external cause status: Secondary | ICD-10-CM | POA: Diagnosis not present

## 2016-05-18 DIAGNOSIS — S52501A Unspecified fracture of the lower end of right radius, initial encounter for closed fracture: Secondary | ICD-10-CM | POA: Diagnosis not present

## 2016-05-18 DIAGNOSIS — Z7982 Long term (current) use of aspirin: Secondary | ICD-10-CM | POA: Insufficient documentation

## 2016-05-18 MED ORDER — OXYCODONE-ACETAMINOPHEN 5-325 MG PO TABS: 1.0000 | ORAL_TABLET | ORAL | 0 refills | Status: DC | PRN
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
## 2016-12-01 ENCOUNTER — Encounter (INDEPENDENT_AMBULATORY_CARE_PROVIDER_SITE_OTHER): Payer: Self-pay

## 2016-12-01 ENCOUNTER — Ambulatory Visit (INDEPENDENT_AMBULATORY_CARE_PROVIDER_SITE_OTHER): Payer: Commercial Managed Care - PPO | Admitting: Primary Care

## 2016-12-01 ENCOUNTER — Encounter: Payer: Self-pay | Admitting: Primary Care

## 2016-12-01 VITALS — BP 180/108 | HR 77 | Temp 98.1°F | Ht 72.0 in | Wt 202.8 lb

## 2016-12-01 DIAGNOSIS — I10 Essential (primary) hypertension: Secondary | ICD-10-CM | POA: Diagnosis not present

## 2016-12-01 DIAGNOSIS — Z720 Tobacco use: Secondary | ICD-10-CM | POA: Diagnosis not present

## 2016-12-01 MED ORDER — LISINOPRIL-HYDROCHLOROTHIAZIDE 20-12.5 MG PO TABS: 1.0000 | ORAL_TABLET | Freq: Every day | ORAL | 0 refills | Status: DC

## 2016-12-01 NOTE — Assessment & Plan Note (Signed)
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 Given numerous elevated readings, reading today, will treat.  Rx for lisinopril/HCTZ 20/12.5 mg sent to pharmacy. Discussed DASH diet. Will have him monitor BP at home, follow up in 3 weeks. BMP next visit.

## 2016-12-01 NOTE — Progress Notes (Signed)
Subjective:    Patient ID: Benjamin Hardy, male    DOB: Jan 27, 1961, 56 y.o.   MRN: [id number]  HPI  Benjamin Hardy is a 56 year old male who presents today to establish care and discuss the problems mentioned below. Will obtain old records.  1) Elevated blood pressure reading: His BP in the office today is 178/108. 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 He denies chest pain, headaches, dizziness, visual changes. He has a family history of hypertension in his mother and father.   2) Tobacco Abuse: Smoker of 36 years, smokes 1 PPD. He quit once for three years as he was successful on the patch. He tried the patches again which have not worked. He is interested in quitting and has heard a lot of great things about Chantix. He denies cough, shortness of breath, unexplained weight loss.  Review of Systems  Constitutional: Negative for unexpected weight change.  Eyes: Negative for visual disturbance.  Respiratory: Negative for cough, shortness of breath and wheezing.   Cardiovascular: Negative for chest pain.  Neurological: Negative for dizziness and headaches.       Past Medical History:  Diagnosis Date  . Chest pain   . Essential hypertension      Social History   Social History  . Marital status: Married    Spouse name: N/A  . Number of children: N/A  . Years of education: N/A   Occupational History  . warehouse -- [employer]    Social History Main Topics  . Smoking status: Current Every Day Smoker    Packs/day: 1.00    Years: 35.00    Types: Cigarettes  . Smokeless tobacco: Never Used  . Alcohol use No  . Drug use: No  . Sexual activity: Not on file   Other Topics Concern  . Not on file   Social History Narrative   Married, separated.   3 children.    Works as a Naval architecttruck driver, Designer, multimedialocal.   [place]      Past Surgical History:  Procedure Laterality Date  . CERVICAL DISC SURGERY    . DG GALL BLADDER    . NECK SURGERY    . NECK SURGERY  2008    Family History  Problem Relation Age of Onset  . Hypertension    . Diabetes    . Hypertension Mother   . Hypertension Father     No Known Allergies  No current outpatient prescriptions on file prior to visit.   No current facility-administered medications on file prior to visit.     BP (!) 178/108   Pulse 77   Temp 98.1 F (36.7 C) (Oral)   Ht 6' (1.829 m)   Wt 202 lb 12.8 oz (92 kg)   SpO2 98%   BMI 27.50 kg/m    Objective:   Physical Exam  Constitutional: He is oriented to person, place, and time. He appears well-nourished.  Neck: Neck supple.  Cardiovascular: Normal rate and regular rhythm.   Pulmonary/Chest: Effort normal and breath sounds normal. He has no wheezes. He has no rales.  Neurological: He is alert and oriented to person, place, and time.  Skin: Skin is warm and dry.  Psychiatric: He has a normal mood and affect.  Assessment & Plan:

## 2016-12-01 NOTE — Assessment & Plan Note (Signed)
Smoker for 36 years, ready to quit. No improvement with patches and gum. Will trial Chantix next visit as to not start 2 new medications at once. He agrees with the plan.

## 2016-12-01 NOTE — Progress Notes (Signed)
Pre visit review using our clinic review tool, if applicable. No additional management support is needed unless otherwise documented below in the visit note. 

## 2016-12-01 NOTE — Patient Instructions (Signed)
Your blood pressure is too high. It should be less than 140/90.  Start lisinopril/hctz 20/12.5 mg tablets for high blood pressure. Take 1 tablet by mouth once daily.  Work on limiting salty, fatty, fried foods in your diet.  Start exercising. You should be getting 150 minutes of moderate intensity exercise weekly.  Follow up in 3 weeks for re-evaluation of your blood pressure.  It was a pleasure to meet you today! Please don't hesitate to call me with any questions. Welcome to [facility]!   DASH Eating Plan DASH stands for "Dietary Approaches to Stop Hypertension." The DASH eating plan is a healthy eating plan that has been shown to reduce high blood pressure (hypertension). It may also reduce your risk for type 2 diabetes, heart disease, and stroke. The DASH eating plan may also help with weight loss. What are tips for following this plan? General guidelines   Avoid eating more than 2,300 mg (milligrams) of salt (sodium) a day. If you have hypertension, you may need to reduce your sodium intake to 1,500 mg a day.  Limit alcohol intake to no more than 1 drink a day for nonpregnant women and 2 drinks a day for men. One drink equals 12 oz of beer, 5 oz of wine, or 1 oz of hard liquor.  Work with your health care provider to maintain a healthy body weight or to lose weight. Ask what an ideal weight is for you.  Get at least 30 minutes of exercise that causes your heart to beat faster (aerobic exercise) most days of the week. Activities may include walking, swimming, or biking.  Work with your health care provider or diet and nutrition specialist (dietitian) to adjust your eating plan to your individual calorie needs. Reading food labels   Check food labels for the amount of sodium per serving. Choose foods with less than 5 percent of the Daily Value of sodium. Generally, foods with less than 300 mg of sodium per serving fit into this eating plan.  To find whole grains, look for the word  "whole" as the first word in the ingredient list. Shopping   Buy products labeled as "low-sodium" or "no salt added."  Buy fresh foods. Avoid canned foods and premade or frozen meals. Cooking   Avoid adding salt when cooking. Use salt-free seasonings or herbs instead of table salt or sea salt. Check with your health care provider or pharmacist before using salt substitutes.  Do not fry foods. Cook foods using healthy methods such as baking, boiling, grilling, and broiling instead.  Cook with heart-healthy oils, such as olive, canola, soybean, or sunflower oil. Meal planning    Eat a balanced diet that includes:  5 or more servings of fruits and vegetables each day. At each meal, try to fill half of your plate with fruits and vegetables.  Up to 6-8 servings of whole grains each day.  Less than 6 oz of lean meat, poultry, or fish each day. A 3-oz serving of meat is about the same size as a deck of cards. One egg equals 1 oz.  2 servings of low-fat dairy each day.  A serving of nuts, seeds, or beans 5 times each week.  Heart-healthy fats. Healthy fats called Omega-3 fatty acids are found in foods such as flaxseeds and coldwater fish, like sardines, salmon, and mackerel.  Limit how much you eat of the following:  Canned or prepackaged foods.  Food that is high in trans fat, such as fried foods.  Food  that is high in saturated fat, such as fatty meat.  Sweets, desserts, sugary drinks, and other foods with added sugar.  Full-fat dairy products.  Do not salt foods before eating.  Try to eat at least 2 vegetarian meals each week.  Eat more home-cooked food and less restaurant, buffet, and fast food.  When eating at a restaurant, ask that your food be prepared with less salt or no salt, if possible. What foods are recommended? The items listed may not be a complete list. Talk with your dietitian about what dietary choices are best for you. Grains  Whole-grain or whole-wheat  bread. Whole-grain or whole-wheat pasta. Brown rice. Modena Morrow. Bulgur. Whole-grain and low-sodium cereals. Pita bread. Low-fat, low-sodium crackers. Whole-wheat flour tortillas. Vegetables  Fresh or frozen vegetables (raw, steamed, roasted, or grilled). Low-sodium or reduced-sodium tomato and vegetable juice. Low-sodium or reduced-sodium tomato sauce and tomato paste. Low-sodium or reduced-sodium canned vegetables. Fruits  All fresh, dried, or frozen fruit. Canned fruit in natural juice (without added sugar). Meat and other protein foods  Skinless chicken or [place]. Ground chicken or [place]. Pork with fat trimmed off. Fish and seafood. Egg whites. Dried beans, peas, or lentils. Unsalted nuts, nut butters, and seeds. Unsalted canned beans. Lean cuts of beef with fat trimmed off. Low-sodium, lean deli meat. Dairy  Low-fat (1%) or fat-free (skim) milk. Fat-free, low-fat, or reduced-fat cheeses. Nonfat, low-sodium ricotta or cottage cheese. Low-fat or nonfat yogurt. Low-fat, low-sodium cheese. Fats and oils  Soft margarine without trans fats. Vegetable oil. Low-fat, reduced-fat, or light mayonnaise and salad dressings (reduced-sodium). Canola, safflower, olive, soybean, and sunflower oils. Avocado. Seasoning and other foods  Herbs. Spices. Seasoning mixes without salt. Unsalted popcorn and pretzels. Fat-free sweets. What foods are not recommended? The items listed may not be a complete list. Talk with your dietitian about what dietary choices are best for you. Grains  Baked goods made with fat, such as croissants, muffins, or some breads. Dry pasta or rice meal packs. Vegetables  Creamed or fried vegetables. Vegetables in a cheese sauce. Regular canned vegetables (not low-sodium or reduced-sodium). Regular canned tomato sauce and paste (not low-sodium or reduced-sodium). Regular tomato and vegetable juice (not low-sodium or reduced-sodium). Angie Fava. Olives. Fruits  Canned fruit in a light or  heavy syrup. Fried fruit. Fruit in cream or butter sauce. Meat and other protein foods  Fatty cuts of meat. Ribs. Fried meat. Berniece Salines. Sausage. Bologna and other processed lunch meats. Salami. Fatback. Hotdogs. Bratwurst. Salted nuts and seeds. Canned beans with added salt. Canned or smoked fish. Whole eggs or egg yolks. Chicken or [place] with skin. Dairy  Whole or 2% milk, cream, and half-and-half. Whole or full-fat cream cheese. Whole-fat or sweetened yogurt. Full-fat cheese. Nondairy creamers. Whipped toppings. Processed cheese and cheese spreads. Fats and oils  Butter. Stick margarine. Lard. Shortening. Ghee. Bacon fat. Tropical oils, such as coconut, palm kernel, or palm oil. Seasoning and other foods  Salted popcorn and pretzels. Onion salt, garlic salt, seasoned salt, table salt, and sea salt. Worcestershire sauce. Tartar sauce. Barbecue sauce. Teriyaki sauce. Soy sauce, including reduced-sodium. Steak sauce. Canned and packaged gravies. Fish sauce. Oyster sauce. Cocktail sauce. Horseradish that you find on the shelf. Ketchup. Mustard. Meat flavorings and tenderizers. Bouillon cubes. Hot sauce and Tabasco sauce. Premade or packaged marinades. Premade or packaged taco seasonings. Relishes. Regular salad dressings. Where to find more information:  National Heart, Lung, and [place]: https://wilson-eaton.com/  American Heart Association: www.heart.org Summary  The DASH eating plan  is a healthy eating plan that has been shown to reduce high blood pressure (hypertension). It may also reduce your risk for type 2 diabetes, heart disease, and stroke.  With the DASH eating plan, you should limit salt (sodium) intake to 2,300 mg a day. If you have hypertension, you may need to reduce your sodium intake to 1,500 mg a day.  When on the DASH eating plan, aim to eat more fresh fruits and vegetables, whole grains, lean proteins, low-fat dairy, and heart-healthy fats.  Work with your health care provider  or diet and nutrition specialist (dietitian) to adjust your eating plan to your individual calorie needs. This information is not intended to replace advice given to you by your health care provider. Make sure you discuss any questions you have with your health care provider. Document Released: 09/03/2011 Document Revised: 09/07/2016 Document Reviewed: 09/07/2016 Elsevier Interactive Patient Education  2017 [employer]

## 2016-12-22 ENCOUNTER — Ambulatory Visit (INDEPENDENT_AMBULATORY_CARE_PROVIDER_SITE_OTHER): Payer: Self-pay | Admitting: Primary Care

## 2016-12-22 DIAGNOSIS — I10 Essential (primary) hypertension: Secondary | ICD-10-CM

## 2016-12-22 LAB — BASIC METABOLIC PANEL
BUN: 15 mg/dL (ref 6–23)
CALCIUM: 10.1 mg/dL (ref 8.4–10.5)
CHLORIDE: 99 meq/L (ref 96–112)
CO2: 28 meq/L (ref 19–32)
Creatinine, Ser: 1.1 mg/dL (ref 0.40–1.50)
GFR: 73.52 mL/min (ref >60.00)
Glucose, Bld: 101 mg/dL — ABNORMAL HIGH (ref 70–99)
Potassium: 4.3 mEq/L (ref 3.5–5.1)
SODIUM: 133 meq/L — AB (ref 135–145)

## 2016-12-22 MED ORDER — LISINOPRIL-HYDROCHLOROTHIAZIDE 20-12.5 MG PO TABS: 1.0000 | ORAL_TABLET | Freq: Two times a day (BID) | ORAL | 0 refills | Status: DC

## 2016-12-22 NOTE — Progress Notes (Signed)
   Subjective:    Patient ID: Benjamin Hardy, male    DOB: [birth year], 56 y.o.   MRN: [id number]  HPI  Benjamin Hardy is a 56 year old male who presents today for follow up of hypertension.   He presented as a new patient in early March 2018 with several episodes of hypertension. His BP during his visit last week was 180/108 so he was initiated on Lisinopril-HCTZ 20/12.5 mg.   His blood pressure today is 146/94. He's been compliant to his medication. He denies chest pain, headaches, dizziness, cough. He's not checking his blood pressure at home.   Review of Systems  Respiratory: Negative for cough and shortness of breath.   Cardiovascular: Negative for chest pain and leg swelling.  Neurological: Negative for dizziness and headaches.       Past Medical History:  Diagnosis Date  . Chest pain   . Essential hypertension      Social History   Social History  . Marital status: Married    Spouse name: N/A  . Number of children: N/A  . Years of education: N/A   Occupational History  . warehouse -- [employer]    Social History Main Topics  . Smoking status: Current Every Day Smoker    Packs/day: 1.00    Years: 35.00    Types: Cigarettes  . Smokeless tobacco: Never Used  . Alcohol use No  . Drug use: No  . Sexual activity: Not on file   Other Topics Concern  . Not on file   Social History Narrative   Married, separated.   3 children.    Works as a Naval architecttruck driver, Designer, multimedialocal.   [place]     Past Surgical History:  Procedure Laterality Date  . CERVICAL DISC SURGERY    . DG GALL BLADDER    . NECK SURGERY    . NECK SURGERY  2008    Family History  Problem Relation Age of Onset  . Hypertension    . Diabetes    . Hypertension Mother   . Hypertension Father     No Known Allergies  No current outpatient prescriptions on file prior to visit.   No current facility-administered medications on file prior to visit.     BP  (!) 146/94   Pulse 80   Temp 98.3 F (36.8 C) (Oral)   Ht 6' (1.829 m)   Wt 199 lb 12.8 oz (90.6 kg)   SpO2 98%   BMI 27.10 kg/m    Objective:   Physical Exam  Constitutional: He appears well-nourished.  Neck: Neck supple.  Cardiovascular: Normal rate and regular rhythm.   Pulmonary/Chest: Effort normal and breath sounds normal.  Skin: Skin is warm and dry.          Assessment & Plan:

## 2016-12-22 NOTE — Assessment & Plan Note (Signed)
Improved, still above goal. No home readings to compare. Increase lisinopril/HCTZ to BID dosing. Check BMP today. He will obtain a BP cuff and start checking at home. Will call for readings in 2 weeks.

## 2016-12-22 NOTE — Progress Notes (Signed)
Pre visit review using our clinic review tool, if applicable. No additional management support is needed unless otherwise documented below in the visit note. 

## 2016-12-22 NOTE — Patient Instructions (Signed)
We've increased the dose of your medication. Take 1 tablet by mouth twice daily, everyday.  Complete lab work prior to leaving today. I will notify you of your results once received.   Check your blood pressure daily, around the same time of day, for the next 2 weeks.  Ensure that you have rested for 30 minutes prior to checking your blood pressure. Record your readings and we will call you for those readings in 2 weeks.  It was a pleasure to see you today!

## 2016-12-28 ENCOUNTER — Other Ambulatory Visit: Payer: Self-pay | Admitting: Primary Care

## 2016-12-28 DIAGNOSIS — I10 Essential (primary) hypertension: Secondary | ICD-10-CM

## 2017-01-05 ENCOUNTER — Telehealth: Payer: Self-pay | Admitting: Primary Care

## 2017-01-05 NOTE — Telephone Encounter (Addendum)
-----   Message from Doreene Nest, NP sent at 12/22/2016  5:31 PM EDT ----- Regarding: BP Please check on BP readings. What are they running? (Also needs repeat BMP due to hyponatremia).

## 2017-01-05 NOTE — Telephone Encounter (Addendum)
Tried to call patient but could not leave message due to voicemail is not set up. 

## 2017-01-14 NOTE — Telephone Encounter (Signed)
Spoken to patient and he stated that when he took 2 tablet his blood pressure was so low as in 95/46. So patient then start taking only 1 tablet once a daily. He stated that he feels fine and have not checked it since.  Patient stated that he is too busy right.

## 2017-01-14 NOTE — Telephone Encounter (Signed)
Please schedule him for an office visit in 2 weeks to recheck. Thanks.

## 2017-01-15 NOTE — Telephone Encounter (Addendum)
Tried to call patient but could not leave message due to voicemail is not set up. 

## 2017-01-27 ENCOUNTER — Ambulatory Visit: Payer: Commercial Managed Care - PPO | Admitting: Primary Care

## 2017-03-19 ENCOUNTER — Other Ambulatory Visit: Payer: Self-pay | Admitting: Primary Care

## 2017-03-19 DIAGNOSIS — I10 Essential (primary) hypertension: Secondary | ICD-10-CM
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
## 2021-10-27 ENCOUNTER — Other Ambulatory Visit (HOSPITAL_COMMUNITY): Payer: Self-pay

## 2021-12-31 ENCOUNTER — Ambulatory Visit (HOSPITAL_COMMUNITY)
Admission: EM | Admit: 2021-12-31 | Discharge: 2021-12-31 | Disposition: A | Discharge status: Other Acute Inpatient Hospital | Payer: No Payment, Other | Attending: Psychiatry | Admitting: Psychiatry

## 2021-12-31 DIAGNOSIS — T1491XA Suicide attempt, initial encounter: Secondary | ICD-10-CM | POA: Insufficient documentation

## 2021-12-31 DIAGNOSIS — X838XXA Intentional self-harm by other specified means, initial encounter: Secondary | ICD-10-CM | POA: Insufficient documentation

## 2021-12-31 DIAGNOSIS — Y92410 Unspecified street and highway as the place of occurrence of the external cause: Secondary | ICD-10-CM | POA: Insufficient documentation

## 2021-12-31 DIAGNOSIS — Z20822 Contact with and (suspected) exposure to covid-19: Secondary | ICD-10-CM | POA: Insufficient documentation

## 2021-12-31 DIAGNOSIS — F17210 Nicotine dependence, cigarettes, uncomplicated: Secondary | ICD-10-CM | POA: Diagnosis not present

## 2021-12-31 LAB — POCT URINE DRUG SCREEN - MANUAL ENTRY (I-SCREEN)
POC Amphetamine UR: NOT DETECTED
POC Buprenorphine (BUP): NOT DETECTED
POC Cocaine UR: NOT DETECTED
POC Marijuana UR: NOT DETECTED
POC Methadone UR: NOT DETECTED
POC Methamphetamine UR: NOT DETECTED
POC Morphine: NOT DETECTED
POC Oxazepam (BZO): NOT DETECTED
POC Oxycodone UR: NOT DETECTED
POC Secobarbital (BAR): NOT DETECTED

## 2021-12-31 LAB — COMPREHENSIVE METABOLIC PANEL
ALT: 25 U/L (ref 0–44)
AST: 34 U/L (ref 15–41)
Albumin: 4.2 g/dL (ref 3.5–5.0)
Alkaline Phosphatase: 104 U/L (ref 38–126)
Anion gap: 9 (ref 5–15)
BUN: 5 mg/dL — ABNORMAL LOW (ref 8–23)
CO2: 31 mmol/L (ref 22–32)
Calcium: 10.2 mg/dL (ref 8.9–10.3)
Chloride: 104 mmol/L (ref 98–111)
Creatinine, Ser: 1.24 mg/dL (ref 0.61–1.24)
GFR, Estimated: >60 mL/min (ref >60)
Glucose, Bld: 126 mg/dL — ABNORMAL HIGH (ref 70–99)
Potassium: 4.2 mmol/L (ref 3.5–5.1)
Sodium: 144 mmol/L (ref 135–145)
Total Bilirubin: 1 mg/dL (ref 0.3–1.2)
Total Protein: 8.2 g/dL — ABNORMAL HIGH (ref 6.5–8.1)

## 2021-12-31 LAB — URINALYSIS, COMPLETE (UACMP) WITH MICROSCOPIC
Bacteria, UA: NONE SEEN
Bilirubin Urine: NEGATIVE
Glucose, UA: NEGATIVE mg/dL
Ketones, ur: NEGATIVE mg/dL
Leukocytes,Ua: NEGATIVE
Nitrite: NEGATIVE
Protein, ur: 30 mg/dL — AB
Specific Gravity, Urine: 1.013 (ref 1.005–1.030)
pH: 6 (ref 5.0–8.0)

## 2021-12-31 LAB — CBC WITH DIFFERENTIAL/PLATELET
Abs Immature Granulocytes: 0.02 10*3/uL (ref 0.00–0.07)
Basophils Absolute: 0 10*3/uL (ref 0.0–0.1)
Basophils Relative: 0 %
Eosinophils Absolute: 0 10*3/uL (ref 0.0–0.5)
Eosinophils Relative: 0 %
HCT: 46.7 % (ref 39.0–52.0)
Hemoglobin: 15.5 g/dL (ref 13.0–17.0)
Immature Granulocytes: 0 %
Lymphocytes Relative: 16 %
Lymphs Abs: 1.5 10*3/uL (ref 0.7–4.0)
MCH: 30.9 pg (ref 26.0–34.0)
MCHC: 33.2 g/dL (ref 30.0–36.0)
MCV: 93 fL (ref 80.0–100.0)
Monocytes Absolute: 0.7 10*3/uL (ref 0.1–1.0)
Monocytes Relative: 7 %
Neutro Abs: 7.1 10*3/uL (ref 1.7–7.7)
Neutrophils Relative %: 77 %
Platelets: 230 10*3/uL (ref 150–400)
RBC: 5.02 MIL/uL (ref 4.22–5.81)
RDW: 13.9 % (ref 11.5–15.5)
WBC: 9.3 10*3/uL (ref 4.0–10.5)
nRBC: 0 % (ref 0.0–0.2)

## 2021-12-31 LAB — TSH: TSH: 1.853 u[IU]/mL (ref 0.350–4.500)

## 2021-12-31 LAB — RESP PANEL BY RT-PCR (FLU A&B, COVID) ARPGX2
Influenza A by PCR: NEGATIVE
Influenza B by PCR: NEGATIVE
SARS Coronavirus 2 by RT PCR: NEGATIVE

## 2021-12-31 LAB — LIPID PANEL
Cholesterol: 186 mg/dL (ref 0–200)
HDL: 47 mg/dL (ref >40)
LDL Cholesterol: 125 mg/dL — ABNORMAL HIGH (ref 0–99)
Total CHOL/HDL Ratio: 4 RATIO
Triglycerides: 72 mg/dL (ref <150)
VLDL: 14 mg/dL (ref 0–40)

## 2021-12-31 LAB — POC SARS CORONAVIRUS 2 AG -  ED: SARS Coronavirus 2 Ag: NEGATIVE

## 2021-12-31 LAB — ETHANOL: Alcohol, Ethyl (B): <10 mg/dL (ref <10)

## 2021-12-31 LAB — POC SARS CORONAVIRUS 2 AG: SARSCOV2ONAVIRUS 2 AG: NEGATIVE

## 2021-12-31 LAB — MAGNESIUM: Magnesium: 2 mg/dL (ref 1.7–2.4)

## 2021-12-31 LAB — HEMOGLOBIN A1C
Hgb A1c MFr Bld: 5.6 % (ref 4.8–5.6)
Mean Plasma Glucose: 114.02 mg/dL

## 2021-12-31 MED ORDER — LORAZEPAM 1 MG PO TABS: 1.0000 mg | ORAL_TABLET | ORAL | Status: DC | PRN

## 2021-12-31 MED ORDER — ZIPRASIDONE MESYLATE 20 MG IM SOLR: 20.0000 mg | INTRAMUSCULAR | Status: DC | PRN

## 2021-12-31 MED ORDER — MAGNESIUM HYDROXIDE 400 MG/5ML PO SUSP: 30.0000 mL | Freq: Every day | ORAL | Status: DC | PRN

## 2021-12-31 MED ORDER — NICOTINE 21 MG/24HR TD PT24
MEDICATED_PATCH | TRANSDERMAL | Status: AC
  Filled 2021-12-31: qty 1

## 2021-12-31 MED ORDER — HYDROXYZINE HCL 25 MG PO TABS
25.0000 mg | ORAL_TABLET | Freq: Three times a day (TID) | ORAL | Status: DC | PRN
Start: 2021-12-31 — End: 2022-01-01

## 2021-12-31 MED ORDER — ACETAMINOPHEN 325 MG PO TABS: 650.0000 mg | ORAL_TABLET | Freq: Four times a day (QID) | ORAL | Status: DC | PRN

## 2021-12-31 MED ORDER — OLANZAPINE 5 MG PO TBDP: 5.0000 mg | ORAL_TABLET | Freq: Three times a day (TID) | ORAL | Status: DC | PRN

## 2021-12-31 MED ORDER — NICOTINE 21 MG/24HR TD PT24
21.0000 mg | MEDICATED_PATCH | Freq: Once | TRANSDERMAL | Status: DC
  Administered 2021-12-31: 21 mg via TRANSDERMAL

## 2021-12-31 MED ORDER — TRAZODONE HCL 50 MG PO TABS
50.0000 mg | ORAL_TABLET | Freq: Every evening | ORAL | Status: DC | PRN
Start: 2021-12-31 — End: 2022-01-01

## 2021-12-31 MED ORDER — ALUM & MAG HYDROXIDE-SIMETH 200-200-20 MG/5ML PO SUSP: 30.0000 mL | ORAL | Status: DC | PRN

## 2021-12-31 NOTE — Progress Notes (Signed)
Patient has been denied by [facility] due to restrictions and has been faxed out. Patient meets [place] inpatient criteria per Tharon Aquas, NP. Patient has been faxed out to the following facilities:  ? ? ?[facility]  [address] [place] [address] [phone]  ?[place], [place] [id number] [phone]  ?[place]  [place], [place] [address] [phone]  ?[facility]  [address] [place] [address] [phone]  ?[facility]  [address] [place], [place] [address] [phone]  ?[facility]  [place], [place] [address] [phone]  ?[facility]  [address], [place] [address] [phone]  ?[facility]  [address] [place] [address] [phone]  ?[place]  [place] [place] [address] [phone]  ?[facility]  [address], [place] [address] [phone]  ?[place]  [address] [place] [address] [id number]  ?[facility]  [place], [place] [address] [phone]  ?[facility]  [address], [place] [address] [phone]  ?[place] [address] [place] [address] [phone]  ?[facility]  [address] [place] [address] [phone]  ?[place] [place] [address] [phone]  ?[place] [place] [address] [phone]   ?[facility]  [place], [place] [address] [phone]  ?Mariea Clonts, MSW, LCSW-A  ?1:57 PM 12/31/2021   ?
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
## 2021-12-31 NOTE — ED Provider Notes (Signed)
FBC/OBS ASAP Discharge Summary ? ?Date and Time: 12/31/2021 3:50 PM  ?Name: Benjamin Hardy  ?MRN:  [id number]  ? ?Discharge Diagnoses:  ?Final diagnoses:  ?Suicide attempt [facility])  ? ?Subjective: Pt accepted to [facility] for inpatient hospitalization.  ? ?HPI from today:  ? ?Pt presents to [facility] behavioral health under IVC  with police escort. Pt is assessed face-to-face by nurse practitioner. ?  ?Per IVC "THE PETITIONER STATES THE FOLLOWING: THE RESPONDENT HAS BEEN THREATENING TO KILL HIMSELF. YESTERDAY THE RESPONDENT MADE CONTACT WITH HIS EX-GIRLFRIEND BY SENDING HER A PICTURE OF HIMSELF WITH A GUN TO HIS HEAD, [place] TO SHOOT HIMSELF. TODAY, THE RESPONDENT ATTACHED A HOSE TO HIS VEHICLE AND TAPED THE WINDOWS SHUT, DRIVING HIMSELF AROUND IN AN ATTEMPT TO KILL HIMSELF." ?  ?Per pt, has been feeling sad, depressed since break up with his ex-girlfriend of 1.5 years on 11/25/2021. States he was living with his ex-girlfriend, when he came home from work, he saw that she had moved all of her things out of the home. Reports feeling shocked by this. States ex-girlfriend "is the love of my life". States that he had been in contact w/ her since her moving out. States that this past Sunday made meatloaf for her and spent 5 hours together. Reports that this morning, he was in the car, and talking with his ex-girlfriend on the phone. Per pt, ex-girlfriend told him that she met someone new at the gym. Reports telling her "you are the only thing I know" "I would kill myself". Pt states that he connected a hose to his exhaust pipe and was driving around in an attempt to kill himself. States that windows were slightly open at the time. Pt minimizing SA, reports "I'm fine", "I'll get over it", "changed my mind".  ?  ?Per BHRT clinician, law enforcement verified that pt did send messages to his ex-gf stating he can't live without her and picture of pt w/ gun to his head. ?  ?Pt owns 2 firearms. States has access to his  375 magnum. His ex girlfriend has his 380 pistol.  ?  ?Reports use of alcohol 1-2 shots every month and a half, last used 3 weeks ago. Reports smoking pack of cigarettes every 1.5 days. ?  ?Denies current SI/VI/HI. Denies hx of NSSI, SA. Denies AVH, paranoia, delusions.  ?  ?Denies hx of inpatient psychiatric hospitalization, counseling, medication trials, diagnoses.  ?  ?Denies current medications.  ?  ?Denies allergies.  ?  ?Denies legal hx.  ?  ?Pt residing w/ his youngest son.  ?  ?Pt gives verbal consent to speak with his ex-girlfriend and provided phone number Baxter Flattery, [phone]). Attempted collateral with Baxter Flattery without success. ?  ?Plan is for inpatient admission. ? ?Stay Summary: Pt presents to [facility] today under IVC petition. Pt accepted to [facility] for inpatient psychiatric hospitalization. ? ?Total Time spent with patient: 30 minutes ? ?Past Psychiatric History: Pt denies psychiatric hx. ?Past Medical History:  ?Past Medical History:  ?Diagnosis Date  ? Chest pain   ? Essential hypertension   ?  ?Past Surgical History:  ?Procedure Laterality Date  ? CERVICAL DISC SURGERY    ? DG GALL BLADDER    ? NECK SURGERY    ? NECK SURGERY  2008  ? ?Family History:  ?Family History  ?Problem Relation Age of Onset  ? Hypertension Unknown   ? Diabetes Unknown   ? Hypertension Mother   ? Hypertension Father   ? ?Family  Psychiatric History: Pt denies family psychiatric hx. ?Social History:  ?Social History  ? ?Substance and Sexual Activity  ?Alcohol Use No  ?   ?Social History  ? ?Substance and Sexual Activity  ?Drug Use No  ?  ?Social History  ? ?Socioeconomic History  ? Marital status: Married  ?  Spouse name: Not on file  ? Number of children: Not on file  ? Years of education: Not on file  ? Highest education level: Not on file  ?Occupational History  ? Occupation: Proofreader -- [employer]  ?Tobacco Use  ? Smoking status: Every Day  ?  Packs/day: 1.00  ?  Years: 35.00  ?  Pack years: 35.00  ?  Types: Cigarettes  ?  Smokeless tobacco: Never  ?Substance and Sexual Activity  ? Alcohol use: No  ? Drug use: No  ? Sexual activity: Not on file  ?Other Topics Concern  ? Not on file  ?Social History Narrative  ? Married, separated.  ? 3 children.   ? Works as a Administrator, Radiation protection practitioner.  ? Enjoys driving to [place] on his motorcycle.   ? ?Social Determinants of Health  ? ?Financial Resource Strain: Not on file  ?Food Insecurity: Not on file  ?Transportation Needs: Not on file  ?Physical Activity: Not on file  ?Stress: Not on file  ?Social Connections: Not on file  ? ?SDOH:  ?SDOH Screenings  ? ?Alcohol Screen: Not on file  ?Depression (PHQ2-9): Medium Risk  ? PHQ-2 Score: 21  ?Financial Resource Strain: Not on file  ?Food Insecurity: Not on file  ?Housing: Not on file  ?Physical Activity: Not on file  ?Social Connections: Not on file  ?Stress: Not on file  ?Tobacco Use: Not on file  ?Transportation Needs: Not on file  ? ? ?Tobacco Cessation:  Prescription not provided because: pt accepted to [facility] for inpatient psychiatric hospitalization ? ?Current Medications:  ?Current Facility-Administered Medications  ?Medication Dose Route Frequency Provider Last Rate Last Admin  ? acetaminophen (TYLENOL) tablet 650 mg  650 mg Oral Q6H PRN Tharon Aquas, NP      ? alum & mag hydroxide-simeth (MAALOX/MYLANTA) 200-200-20 MG/5ML suspension 30 mL  30 mL Oral Q4H PRN Tharon Aquas, NP      ? hydrOXYzine (ATARAX) tablet 25 mg  25 mg Oral TID PRN Tharon Aquas, NP      ? OLANZapine zydis (ZYPREXA) disintegrating tablet 5 mg  5 mg Oral Q8H PRN Tharon Aquas, NP      ? And  ? LORazepam (ATIVAN) tablet 1 mg  1 mg Oral PRN Tharon Aquas, NP      ? And  ? ziprasidone (GEODON) injection 20 mg  20 mg Intramuscular PRN Tharon Aquas, NP      ? magnesium hydroxide (MILK OF MAGNESIA) suspension 30 mL  30 mL Oral Daily PRN Tharon Aquas, NP      ? traZODone (DESYREL) tablet 50 mg  50 mg Oral QHS PRN  Tharon Aquas, NP      ? ?No current outpatient medications on file.  ? ? ?PTA Medications: (Not in a hospital admission) ? ? ?Musculoskeletal  ?Strength & Muscle Tone: within normal limits ?Gait & Station: normal ?Patient leans: N/A ? ?Psychiatric Specialty Exam  ?Presentation  ?General Appearance: Appropriate for Environment; Casual ? ?Eye Contact:Good ? ?Speech:Clear and Coherent; Normal Rate ? ?Speech Volume:Normal ? ?Handedness:No data recorded ? ?Mood and Affect  ?Mood:Depressed ? ?Affect:Congruent;  Tearful ? ? ?Thought Process  ?Thought Processes:Coherent; Goal Directed; Linear ? ?Descriptions of Associations:Intact ? ?Orientation:Full (Time, Place and Person) ? ?Thought Content:Logical ? Diagnosis of Schizophrenia or Schizoaffective disorder in past: No ?  ? Hallucinations:Hallucinations: None ? ?Ideas of Reference:None ? ?Suicidal Thoughts:Suicidal Thoughts: No ? ?Homicidal Thoughts:Homicidal Thoughts: No ? ? ?Sensorium  ?Memory:Immediate Good; Recent Good; Remote Good ? ?Judgment:Impaired ? ?Insight:Shallow ? ? ?Executive Functions  ?Concentration:Good ? ?Attention Span:Good ? ?Recall:Good ? ?Fund of [place] ? ?Language:Good ? ? ?Psychomotor Activity  ?Psychomotor Activity:Psychomotor Activity: Normal ? ? ?Assets  ?Assets:Communication Skills; Housing ? ? ?Sleep  ?Sleep:Sleep: Poor ?Number of Hours of Sleep: 4 ? ? ?Nutritional Assessment (For OBS and FBC admissions only) ?Has the patient had a weight loss or gain of 10 pounds or more in the last 3 months?: -- (Unknown to pt) ?Has the patient had a decrease in food intake/or appetite?: Yes ?Does the patient have dental problems?: No ?Does the patient have eating habits or behaviors that may be indicators of an eating disorder including binging or inducing vomiting?: No ?Has the patient recently lost weight without trying?: 2.0 ?Has the patient been eating poorly because of a decreased appetite?: 1 ?Malnutrition Screening Tool Score:  3 ? ? ? ?Physical Exam  ?Physical Exam ?Constitutional:   ?   Appearance: Normal appearance.  ?HENT:  ?   Head: Normocephalic and atraumatic.  ?Cardiovascular:  ?   Rate and Rhythm: Normal rate.  ?Pulmonary:  ?   E

## 2021-12-31 NOTE — ED Notes (Signed)
Pt admitted to overnight observation due to SUA via exhaust hose to car. Pt states, "I turned on my car, tied something to the exhaust pipe but I kept the window down. I just told my girlfriend that because she called and told me that she found another man after 1 1/2 yrs of being in a relationship with me. But I wouldn't really kill myself. That's why I left the window down (laughing)". Encouragement provided. Pt cooperative throughout assessment process. Oriented to unit and unit rules. Meal offered. Safety maintained. ?

## 2021-12-31 NOTE — BH Assessment (Signed)
Comprehensive Clinical Assessment (CCA) Note ? ?12/31/2021 ?Benjamin Hardy ?[id number] ? ?Disposition: Per Tharon Aquas, NP inpatient treatment is recommended. [place] to review.  Disposition SW to pursue appropriate inpatient options. ? ?The patient demonstrates the following risk factors for suicide: Chronic risk factors for suicide include: psychiatric disorder of untreated severe depressive episode related to recent break up . Acute risk factors for suicide include: family or marital conflict, social withdrawal/isolation, loss (financial, interpersonal, professional), and break up . Protective factors for this patient include: positive social support. Considering these factors, the overall suicide risk at this point appears to be high. Patient is not appropriate for outpatient follow up. ? ?Patient is a 61 year old male with a history of untreated severe situational depression who presents to [facility] under IVC, initiated by [employer], for assessment.  [employer] initiated IVC immediately, due to concerns patient had a suicide attempt today per report from his ex- girlfriend.  When officers made contact with patient, per [facility] clinician, patient had windows taped and garden hose conntected to exhaust and taped inside car window.  Patient's now ex-gf broke up with him on 11/25/2021 after 1.5 yr relationship, per patient "she just left that day." It appears ex may be sending mixed messges, as she spent 5 hours with him on Sunday.  Patient was speaking to his ex on the phone today, and during the call she informed him that she is seeing someone else. Patient then made statements about her "being the only thing I know.  I would kill myself." Per IVC, "Yesterday, respondent sent a picture of himself with a gun to his head threatening to shoot himself.  Today, he attached a hose to his vehicle exhaust and taped windown shut, driving himself around in an attempt to kill himself." Patient denies current SI, and  minimized the incident today and states, "I'm fine.  I'll get over it and I've changed my mind." Patient owns and has access to one of his guns, a 375 magnum.  His ex has his 380 pistol.   He denies HI and AVH.  He reports he drinks once per month, 1-2 shots. Last alcohol use was 3 wks ago.  Patient denies hx of attempts and he denies past psychiatric hx.   He insists that staff call his ex-gf, stating she will confirm he is safe to d/c. ? ?Provider and clinician have been unable to reach patient's girlfriend, Baxter Flattery [phone]).   Petitioner, officer on scene, confirmed phone number for ex.  He also mentioned that the ex made a comment about changing her number, as she does not want to be contacted by patient.   ? ? ?Chief Complaint: Suicide attempt ? 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 ?  ? ?[place] ED from 12/31/2021 in [facility]  ?C-SSRS RISK CATEGORY High Risk  ? ?  ? ? ? ?CCA Screening, Triage and Referral (STR) ? ?Patient Reported Information ?How did you hear about [place]? Legal System ? ?What Is the Reason for Your Visit/Call Today? Patient presents under IVC, initated by [employer] due to concerns patient had a suicide attempt today per report from his ex- girlfriend.  Patient was speaking to his ex on the  phone today, and during the call she informed him that she is seeing someone else. Patient has made statements about her "being the only thing I know.  I knew I would have to kill myself." Per IVC, "Yesterday, respondent sent a picture of himself with a gun to his head threatening to shoot himself.  Today, he attached a hose to his vehicle exhaust and taped windown shut, driving himself afround in an attempt to kill  himself." Patient denies current SI, and minimized the incident today.  He denies HI and AVH.  He reports he drinks once per month, 1-2 shots. Last alcohol use was 3 wks ago.  Patient denies hx of attempts and he denies past psychiatric hx, stating he would never "do it."  He insists that staff call his ex-gf, stating she will confirm he is safe to d/c. ? ?How Long Has This Been Causing You Problems? 1 wk - 1 month ? ?What Do You Feel Would Help You the Most Today? Treatment for Depression or other mood problem ? ? ?Have You Recently Had Any Thoughts About Hurting Yourself? Yes ? ?Are You Planning to Commit Suicide/Harm Yourself At This time? No ? ? ?Have you Recently Had Thoughts About [place]? No ? ?Are You Planning to Harm Someone at This Time? No ? ?Explanation: No data recorded ? ?Have You Used Any Alcohol or Drugs in the Past 24 Hours? No ? ?How Long Ago Did You Use Drugs or Alcohol? No data recorded ?What Did You Use and How Much? No data recorded ? ?Do You Currently Have a Therapist/Psychiatrist? No ? ?Name of Therapist/Psychiatrist: No data recorded ? ?Have You Been Recently Discharged From Any Office Practice or Programs? No ? ?Explanation of Discharge From Practice/Program: No data recorded ? ?  ?CCA Screening Triage Referral Assessment ?Type of Contact: Face-to-Face ? ?Telemedicine Service Delivery:   ?Is this Initial or Reassessment? No data recorded ?Date Telepsych consult ordered in [facility]:  No data recorded ?Time Telepsych consult ordered in [facility]:  No data recorded ?Location of Assessment: [facility] ? ?Provider Location: [facility] ? ? ?Collateral Involvement: Attempting to reach ex-gf ? ? ?Does Patient Have a Stage manager Guardian? No data recorded ?Name and Contact of Legal Guardian: No data recorded ?If Minor and Not Living with Parent(s), Who has Custody? No data recorded ?Is CPS involved or ever been involved? Never ? ?Is APS involved or ever  been involved? Never ? ? ?Patient Determined To Be At Risk for Harm To Self or Others Based on Review of Patient Reported Information or Presenting Complaint? Yes, for Self-Harm ? ?Method: No data recorded ?Availability of Means: No data recorded ?Intent: No data recorded ?Notification Required: No data recorded ?Additional Information for Danger to Others Potential: No data recorded ?Additional Comments for Danger to Others Potential: No data recorded ?Are There Guns or Other Weapons in [place]? No data recorded ?Types of Guns/Weapons: No data recorded ?Are These Weapons Safely Secured?                            No data recorded ?Who Could Verify You Are Able To Have These Secured: No data recorded ?Do You Have any Outstanding Charges, Pending Court Dates, Parole/Probation? No data recorded ?Contacted To Inform of Risk of Harm To Self or Others: Family/Significant Other:; Law Enforcement ? ? ? ?Does Patient Present under Involuntary Commitment? Yes ? ?IVC Papers Initial  File Date: 12/31/21 ? ? ?[place] of Residence: Kathleen Argue ? ? ?Patient Currently Receiving the Following Services: Not Receiving Services ? ? ?Determination of Need: Emergent (2 hours) ? ? ?Options For Referral: Inpatient Hospitalization ? ? ? ? ?CCA Biopsychosocial ?Patient Reported Schizophrenia/Schizoaffective Diagnosis in Past: No ? ? ?Strengths: Working, has family support ? ? ?Mental Health Symptoms ?Depression:   ?Difficulty Concentrating; Hopelessness; Worthlessness; Sleep (too much or little) ?  ?Duration of Depressive symptoms:  ?Duration of Depressive Symptoms: Greater than two weeks ?  ?Mania:   ?None ?  ?Anxiety:    ?Tension; Sleep; Worrying; Restlessness ?  ?Psychosis:   ?None ?  ?Duration of Psychotic symptoms:    ?Trauma:   ?None ?  ?Obsessions:   ?None ?  ?Compulsions:   ?None ?  ?Inattention:   ?N/A ?  ?Hyperactivity/Impulsivity:   ?N/A ?  ?Oppositional/Defiant Behaviors:   ?N/A ?  ?Emotional Irregularity:   ?Recurrent suicidal  behaviors/gestures/threats; Potentially harmful impulsivity; Mood lability ?  ?Other Mood/Personality Symptoms:   ?Significant situational depression related to recent break up ?  ? ?Mental Status Exam ?Appear

## 2021-12-31 NOTE — Discharge Instructions (Addendum)
Follow up with inpatient hospitalization ?

## 2021-12-31 NOTE — Progress Notes (Signed)
BHH/BMU LCSW Progress Note ?  ?12/31/2021    3:05 PM ? ?Benjamin Hardy  ? ?[id number]  ? ?Type of Contact and Topic:  Psychiatric Bed Placement  ? ?Pt accepted to [facility] Unit   ? ?Patient meets inpatient criteria per Elvin So, NP ? ?The attending provider will be Brantley Fling, MD  ? ?Call report to [phone] ? ?Marinda Elk, RN @ [facility] notified.    ? ?Pt scheduled  to arrive at [facility] ANYTIME.  ? ?Mariea Clonts, MSW, LCSW-A  ?3:06 PM 12/31/2021   ?  ? ?  ?  ? ? ? ? ?  ?

## 2021-12-31 NOTE — ED Notes (Signed)
Report given to Jamesetta Orleans, RN @[facility]. Pt transported by sheriff to [facility]. Safety maintained. ?

## 2022-01-01 LAB — RPR: RPR Ser Ql: NONREACTIVE

## 2022-01-26 ENCOUNTER — Telehealth (HOSPITAL_COMMUNITY): Payer: Self-pay

## 2022-01-26 NOTE — BH Assessment (Signed)
Care Management - Follow Up BHUC Discharges  ? ?Patient has been placed in an inpatient psychiatric hospital [facility]) on 12-31-2021. ?

## 2022-07-12 DIAGNOSIS — H1031 Unspecified acute conjunctivitis, right eye: Secondary | ICD-10-CM | POA: Insufficient documentation

## 2022-07-12 DIAGNOSIS — D649 Anemia, unspecified: Secondary | ICD-10-CM | POA: Insufficient documentation

## 2022-07-12 DIAGNOSIS — N132 Hydronephrosis with renal and ureteral calculous obstruction: Secondary | ICD-10-CM | POA: Insufficient documentation

## 2023-02-16 ENCOUNTER — Encounter: Payer: Self-pay | Admitting: Nurse Practitioner

## 2023-02-16 ENCOUNTER — Ambulatory Visit (INDEPENDENT_AMBULATORY_CARE_PROVIDER_SITE_OTHER): Payer: Medicaid Other | Admitting: Nurse Practitioner

## 2023-02-16 VITALS — BP 160/90 | HR 75 | Temp 99.1°F | Resp 16 | Ht 70.0 in | Wt 178.0 lb

## 2023-02-16 DIAGNOSIS — K4090 Unilateral inguinal hernia, without obstruction or gangrene, not specified as recurrent: Secondary | ICD-10-CM | POA: Diagnosis not present

## 2023-02-16 DIAGNOSIS — Z122 Encounter for screening for malignant neoplasm of respiratory organs: Secondary | ICD-10-CM | POA: Diagnosis not present

## 2023-02-16 DIAGNOSIS — Z1211 Encounter for screening for malignant neoplasm of colon: Secondary | ICD-10-CM

## 2023-02-16 DIAGNOSIS — Z125 Encounter for screening for malignant neoplasm of prostate: Secondary | ICD-10-CM

## 2023-02-16 DIAGNOSIS — I10 Essential (primary) hypertension: Secondary | ICD-10-CM | POA: Diagnosis not present

## 2023-02-16 DIAGNOSIS — Z87891 Personal history of nicotine dependence: Secondary | ICD-10-CM

## 2023-02-16 DIAGNOSIS — Z720 Tobacco use: Secondary | ICD-10-CM

## 2023-02-16 LAB — COMPREHENSIVE METABOLIC PANEL
ALT: 11 U/L (ref 0–53)
AST: 20 U/L (ref 0–37)
Albumin: 4.1 g/dL (ref 3.5–5.2)
Alkaline Phosphatase: 87 U/L (ref 39–117)
BUN: 19 mg/dL (ref 6–23)
CO2: 31 mEq/L (ref 19–32)
Calcium: 9.4 mg/dL (ref 8.4–10.5)
Chloride: 100 mEq/L (ref 96–112)
Creatinine, Ser: 1.07 mg/dL (ref 0.40–1.50)
GFR: 74.41 mL/min (ref >60.00)
Glucose, Bld: 98 mg/dL (ref 70–99)
Potassium: 4.5 mEq/L (ref 3.5–5.1)
Sodium: 139 mEq/L (ref 135–145)
Total Bilirubin: 1.2 mg/dL (ref 0.2–1.2)
Total Protein: 7.1 g/dL (ref 6.0–8.3)

## 2023-02-16 LAB — CBC
HCT: 41.6 % (ref 39.0–52.0)
Hemoglobin: 13.5 g/dL (ref 13.0–17.0)
MCHC: 32.4 g/dL (ref 30.0–36.0)
MCV: 90.9 fl (ref 78.0–100.0)
Platelets: 183 10*3/uL (ref 150.0–400.0)
RBC: 4.58 Mil/uL (ref 4.22–5.81)
RDW: 13.9 % (ref 11.5–15.5)
WBC: 6.6 10*3/uL (ref 4.0–10.5)

## 2023-02-16 LAB — PSA: PSA: 3.1 ng/mL (ref 0.10–4.00)

## 2023-02-16 LAB — URINALYSIS, MICROSCOPIC ONLY
RBC / HPF: NONE SEEN (ref 0–?)
WBC, UA: NONE SEEN (ref 0–?)

## 2023-02-16 LAB — TSH: TSH: 1.65 u[IU]/mL (ref 0.35–5.50)

## 2023-02-16 MED ORDER — OLMESARTAN MEDOXOMIL 20 MG PO TABS: 20.0000 mg | ORAL_TABLET | Freq: Every day | ORAL | 1 refills | Status: DC

## 2023-02-16 NOTE — Assessment & Plan Note (Signed)
History of the same was trialed on lisinopril/hydrochlorothiazide every 8 doses and patient did not tolerate the medication.  States "I cannot do anything" will try patient on olmesartan 20 mg daily.

## 2023-02-16 NOTE — Assessment & Plan Note (Signed)
History of the same patient has stopped smoking for approximately 1 year.  Will check urine microscopy to rule out occult hematuria

## 2023-02-16 NOTE — Assessment & Plan Note (Signed)
Large right-sided inguinal hernia that is reducible in the supine position.  Ambulatory referral to surgery is discussing patient's symptoms with being erect and walking around.  Signs and symptoms reviewed when to seek urgent emergent healthcare in regards to hernia.

## 2023-02-16 NOTE — Progress Notes (Signed)
New Patient Office Visit  Subjective    Patient ID: Benjamin Hardy, male    DOB: 1960-11-06  Age: 62 y.o. MRN: [id number]  CC:  Chief Complaint  Patient presents with   Establish Care    HPI [name] presents to establish care   HTN: states that he use to be on medication. States that he has been on lisinorpil 20-12.5 took all movtatino away     Former tobacco use: states tha he has been quit for approx 1 year.   Knot: states that it has been there for approx 1 year. States that it puts pressure on the leg when he walks. No heavy lifting. Has never had a henia before   Eye doctor: states that he was place on an allergy drip and got new glasses  Colonoscopy: cologuard PSA  LDCT: amb refer  TDAP:  unsure  Flu: use to in the past Covid: refused Shingles: information  Pna: updated     Outpatient Encounter Medications as of 02/16/2023  Medication Sig   olmesartan (BENICAR) 20 MG tablet Take 1 tablet (20 mg total) by mouth daily.   No facility-administered encounter medications on file as of 02/16/2023.    Past Medical History:  Diagnosis Date   Chest pain    Essential hypertension     Past Surgical History:  Procedure Laterality Date   CERVICAL DISC SURGERY     DG GALL BLADDER     NECK SURGERY     NECK SURGERY  2008    Family History  Problem Relation Age of Onset   Hypertension Mother    Hypertension Father    Diabetes Paternal Aunt    Hypertension Other     Social History   Socioeconomic History   Marital status: Legally Separated    Spouse name: Not on file   Number of children: Not on file   Years of education: Not on file   Highest education level: Not on file  Occupational History   Occupation: warehouse -- [employer]  Tobacco Use   Smoking status: Former    Packs/day: 1.00    Years: 35.00    Additional pack years: 0.00    Total pack years: 35.00    Types: Cigarettes    Quit date: 02/26/2022    Years since quitting: 0.9    Smokeless tobacco: Never  Vaping Use   Vaping Use: Never used  Substance and Sexual Activity   Alcohol use: No   Drug use: No   Sexual activity: Not on file  Other Topics Concern   Not on file  Social History Narrative   Married, separated.   3 children.    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 and the mountains on his motorcycle.    Social Determinants of Health   Financial Resource Strain: Not on file  Food Insecurity: Not on file  Transportation Needs: Not on file  Physical Activity: Not on file  Stress: Not on file  Social Connections: Not on file  Intimate Partner Violence: Not on file    Review of Systems  Constitutional:  Negative for chills and fever.  Respiratory:  Negative for shortness of breath.   Cardiovascular:  Negative for chest pain and leg swelling.  Gastrointestinal:  Negative for abdominal pain, blood in stool, constipation, diarrhea, nausea and vomiting.       BM every other day    Genitourinary:  Negative for dysuria and hematuria.  Neurological:  Negative for tingling and headaches.  Psychiatric/Behavioral:  Negative for hallucinations and suicidal ideas.         Objective    BP (!) 160/90   Pulse 75   Temp 99.1 F (37.3 C)   Resp 16   Ht 5\' 10"  (1.778 m)   Wt 178 lb (80.7 kg)   SpO2 97%   BMI 25.54 kg/m   Physical Exam Vitals and nursing note reviewed. Exam conducted with a chaperone present (Shapale W, CMA).  Constitutional:      Appearance: Normal appearance.  HENT:     Right Ear: Tympanic membrane, ear canal and external ear normal.     Left Ear: Tympanic membrane, ear canal and external ear normal.     Mouth/Throat:     Mouth: Mucous membranes are moist.     Pharynx: Oropharynx is clear.  Eyes:     Extraocular Movements: Extraocular movements intact.     Pupils: Pupils are equal, round, and reactive to light.     Comments: Wears glasses  Cardiovascular:     Rate and Rhythm: Normal rate and regular  rhythm.     Heart sounds: Normal heart sounds.  Pulmonary:     Effort: Pulmonary effort is normal.     Breath sounds: Normal breath sounds.  Genitourinary:   Musculoskeletal:     Right lower leg: No edema.     Left lower leg: No edema.  Lymphadenopathy:     Cervical: No cervical adenopathy.  Skin:    General: Skin is warm.  Neurological:     General: No focal deficit present.     Mental Status: He is alert.     Deep Tendon Reflexes:     Reflex Scores:      Bicep reflexes are 2+ on the right side and 2+ on the left side.      Patellar reflexes are 2+ on the right side and 2+ on the left side.    Comments: Bilateral upper and lower extremity strength 5/5         Assessment & Plan:   Problem List Items Addressed This Visit       Cardiovascular and Mediastinum   Essential hypertension - Primary    History of the same was trialed on lisinopril/hydrochlorothiazide every 8 doses and patient did not tolerate the medication.  States "I cannot do anything" will try patient on olmesartan 20 mg daily.      Relevant Medications   olmesartan (BENICAR) 20 MG tablet   Other Relevant Orders   CBC   Comprehensive metabolic panel   TSH     Other   Tobacco abuse    History of the same patient has stopped smoking for approximately 1 year.  Will check urine microscopy to rule out occult hematuria      Former smoker   Relevant Orders   Urine Microscopic   Inguinal hernia of right side without obstruction or gangrene    Large right-sided inguinal hernia that is reducible in the supine position.  Ambulatory referral to surgery is discussing patient's symptoms with being erect and walking around.  Signs and symptoms reviewed when to seek urgent emergent healthcare in regards to hernia.      Relevant Orders   Ambulatory referral to General Surgery   Other Visit Diagnoses     Screening for colon cancer       Relevant Orders   Cologuard   Screening for prostate cancer  Relevant Orders   PSA   Screening for lung cancer       Relevant Orders   Ambulatory Referral Lung Cancer Screening [facility]       Return in about 4 weeks (around 03/16/2023) for BP recheck, BMP.   Audria Nine, NP

## 2023-02-16 NOTE — Patient Instructions (Signed)
Nice to see you today I want to see you in 1 month to recheck your blood pressure I will be in touch with the labs once I have reviewed them I have sent in the blood pressure medication to the pharmacy I have also referred you to the surgeons

## 2023-02-23 ENCOUNTER — Telehealth: Payer: Self-pay | Admitting: Surgery

## 2023-02-23 ENCOUNTER — Ambulatory Visit: Payer: Self-pay | Admitting: Surgery

## 2023-02-23 ENCOUNTER — Ambulatory Visit (INDEPENDENT_AMBULATORY_CARE_PROVIDER_SITE_OTHER): Payer: Medicaid Other | Admitting: Surgery

## 2023-02-23 ENCOUNTER — Encounter: Payer: Self-pay | Admitting: Surgery

## 2023-02-23 VITALS — BP 151/94 | HR 79 | Temp 98.0°F | Ht 70.0 in | Wt 183.0 lb

## 2023-02-23 DIAGNOSIS — K4090 Unilateral inguinal hernia, without obstruction or gangrene, not specified as recurrent: Secondary | ICD-10-CM

## 2023-02-23 NOTE — Progress Notes (Signed)
Patient ID: Benjamin Hardy, male   DOB: 1960/11/30, 63 y.o.   MRN: [id number]  Chief Complaint: Right inguinal hernia  History of Present Illness Benjamin Hardy is a 62 y.o. male with a known right groin bulge over the last 7 months, progressively increasing in size.  Patient reports pain primarily worsening with walking and affecting his gait.  He reports constipation, denies any issues with voiding.  Reports it is readily reducible when he is in a recumbent position.  Denies fevers or chills or previous abdominal surgery or hernia surgery.  Past Medical History Past Medical History:  Diagnosis Date   Chest pain    Essential hypertension       Past Surgical History:  Procedure Laterality Date   CERVICAL DISC SURGERY     DG GALL BLADDER     NECK SURGERY     NECK SURGERY  2008    No Known Allergies  Current Outpatient Medications  Medication Sig Dispense Refill   olmesartan (BENICAR) 20 MG tablet Take 1 tablet (20 mg total) by mouth daily. 30 tablet 1   No current facility-administered medications for this visit.    Family History Family History  Problem Relation Age of Onset   Hypertension Mother    Hypertension Father    Diabetes Paternal Aunt    Hypertension Other       Social History Social History   Tobacco Use   Smoking status: Former    Packs/day: 1.00    Years: 35.00    Additional pack years: 0.00    Total pack years: 35.00    Types: Cigarettes    Quit date: 02/26/2022    Years since quitting: 0.9    Passive exposure: Past   Smokeless tobacco: Never  Vaping Use   Vaping Use: Never used  Substance Use Topics   Alcohol use: No   Drug use: No        Review of Systems  Constitutional: Negative.   HENT: Negative.    Eyes: Negative.   Respiratory: Negative.    Cardiovascular: Negative.   Gastrointestinal: Negative.   Genitourinary: Negative.   Skin: Negative.   Neurological: Negative.   Psychiatric/Behavioral: Negative.       Physical  Exam Blood pressure (!) 151/94, pulse 79, temperature 98 F (36.7 C), height 5\' 10"  (1.778 m), weight 183 lb (83 kg), SpO2 97 %. Last Weight  Most recent update: 02/23/2023  9:37 AM    Weight  83 kg (183 lb)             CONSTITUTIONAL: Well developed, and nourished, appropriately responsive and aware without distress.   EYES: Sclera non-icteric.   EARS, NOSE, MOUTH AND THROAT:  The oropharynx is clear. Oral mucosa is pink and moist.    Hearing is intact to voice.  NECK: Trachea is midline, and there is no jugular venous distension.  LYMPH NODES:  Lymph nodes in the neck are not appreciated. RESPIRATORY:  Lungs are clear, and breath sounds are equal bilaterally.  Normal respiratory effort without pathologic use of accessory muscles. CARDIOVASCULAR: Heart is regular in rate and rhythm.   Well perfused.  GI: The abdomen is  soft, nontender, and nondistended. There were no palpable masses.  I did not appreciate hepatosplenomegaly.  GU: Readily apparent large bulge in the right groin, it does not descend into the scrotum.  Readily reducible, soft and nontender.  Fairly readily palpable, and suspiciously direct defect.  Some fullness noted on the left, potentially an  occult hernia there. MUSCULOSKELETAL:  Symmetrical muscle tone appreciated in all four extremities.    SKIN: Skin turgor is normal. No pathologic skin lesions appreciated.  NEUROLOGIC:  Motor and sensation appear grossly normal.  Cranial nerves are grossly without defect. PSYCH:  Alert and oriented to person, place and time. Affect is appropriate for situation.  Data Reviewed I have personally reviewed what is currently available of the patient's imaging, recent labs and medical records.   Labs:     Latest Ref Rng & Units 02/16/2023   11:54 AM 12/31/2021    1:38 PM 11/09/2011    8:45 PM  CBC  WBC 4.0 - 10.5 K/uL 6.6  9.3  18.7   Hemoglobin 13.0 - 17.0 g/dL 16.1  09.6  04.5   Hematocrit 39.0 - 52.0 % 41.6  46.7  44.6    Platelets 150.0 - 400.0 K/uL 183.0  230  225       Latest Ref Rng & Units 02/16/2023   11:54 AM 12/31/2021    1:38 PM 12/22/2016    3:04 PM  CMP  Glucose 70 - 99 mg/dL 98  409  811   BUN 6 - 23 mg/dL 19  5  15    Creatinine 0.40 - 1.50 mg/dL 9.14  7.82  9.56   Sodium 135 - 145 mEq/L 139  144  133   Potassium 3.5 - 5.1 mEq/L 4.5  4.2  4.3   Chloride 96 - 112 mEq/L 100  104  99   CO2 19 - 32 mEq/L 31  31  28    Calcium 8.4 - 10.5 mg/dL 9.4  21.3  08.6   Total Protein 6.0 - 8.3 g/dL 7.1  8.2    Total Bilirubin 0.2 - 1.2 mg/dL 1.2  1.0    Alkaline Phos 39 - 117 U/L 87  104    AST 0 - 37 U/L 20  34    ALT 0 - 53 U/L 11  25        Imaging: Radiological images reviewed:   Within last 24 hrs: No results found.  Assessment    Right inguinal hernia, possible bilateral. 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  I discussed possibility of incarceration, strangulation, enlargement in size over time, and the need for emergency surgery in the face of these.  Also reviewed the techniques of reduction should incarceration occur, and when unsuccessful to present to the ED.  Also discussed that surgery risks include recurrence which can be up to 30% in the case of  complex hernias, use of prosthetic materials (mesh) and the increased risk of infection and the possible need for re-operation and removal of mesh, possibility of post-op SBO or ileus, and the risks of general anesthetic including heart attack, stroke, sudden death or some reaction to anesthetic  medications. The patient, and those present, appear to understand the risks, any and all questions were answered to the patient's satisfaction.  No guarantees were ever expressed or implied.   Face-to-face time spent with the patient and accompanying care providers(if present) was 30 minutes, with more than 50% of the time spent counseling, educating, and coordinating care of the patient.    These notes generated with voice recognition software. I apologize for typographical errors.  [name] M.D., FACS 02/23/2023, 10:44 AM

## 2023-02-23 NOTE — H&P (View-Only) (Signed)
Patient ID: Benjamin Hardy, male   DOB: 05/24/1961, 61 y.o.   MRN: [id number]  Chief Complaint: Right inguinal hernia  History of Present Illness Benjamin Hardy is a 61 y.o. male with a known right groin bulge over the last 7 months, progressively increasing in size.  Patient reports pain primarily worsening with walking and affecting his gait.  He reports constipation, denies any issues with voiding.  Reports it is readily reducible when he is in a recumbent position.  Denies fevers or chills or previous abdominal surgery or hernia surgery.  Past Medical History Past Medical History:  Diagnosis Date   Chest pain    Essential hypertension       Past Surgical History:  Procedure Laterality Date   CERVICAL DISC SURGERY     DG GALL BLADDER     NECK SURGERY     NECK SURGERY  2008    No Known Allergies  Current Outpatient Medications  Medication Sig Dispense Refill   olmesartan (BENICAR) 20 MG tablet Take 1 tablet (20 mg total) by mouth daily. 30 tablet 1   No current facility-administered medications for this visit.    Family History Family History  Problem Relation Age of Onset   Hypertension Mother    Hypertension Father    Diabetes Paternal Aunt    Hypertension Other       Social History Social History   Tobacco Use   Smoking status: Former    Packs/day: 1.00    Years: 35.00    Additional pack years: 0.00    Total pack years: 35.00    Types: Cigarettes    Quit date: 02/26/2022    Years since quitting: 0.9    Passive exposure: Past   Smokeless tobacco: Never  Vaping Use   Vaping Use: Never used  Substance Use Topics   Alcohol use: No   Drug use: No        Review of Systems  Constitutional: Negative.   HENT: Negative.    Eyes: Negative.   Respiratory: Negative.    Cardiovascular: Negative.   Gastrointestinal: Negative.   Genitourinary: Negative.   Skin: Negative.   Neurological: Negative.   Psychiatric/Behavioral: Negative.       Physical  Exam Blood pressure (!) 151/94, pulse 79, temperature 98 F (36.7 C), height 5' 10" (1.778 m), weight 183 lb (83 kg), SpO2 97 %. Last Weight  Most recent update: 02/23/2023  9:37 AM    Weight  83 kg (183 lb)             CONSTITUTIONAL: Well developed, and nourished, appropriately responsive and aware without distress.   EYES: Sclera non-icteric.   EARS, NOSE, MOUTH AND THROAT:  The oropharynx is clear. Oral mucosa is pink and moist.    Hearing is intact to voice.  NECK: Trachea is midline, and there is no jugular venous distension.  LYMPH NODES:  Lymph nodes in the neck are not appreciated. RESPIRATORY:  Lungs are clear, and breath sounds are equal bilaterally.  Normal respiratory effort without pathologic use of accessory muscles. CARDIOVASCULAR: Heart is regular in rate and rhythm.   Well perfused.  GI: The abdomen is  soft, nontender, and nondistended. There were no palpable masses.  I did not appreciate hepatosplenomegaly.  GU: Readily apparent large bulge in the right groin, it does not descend into the scrotum.  Readily reducible, soft and nontender.  Fairly readily palpable, and suspiciously direct defect.  Some fullness noted on the left, potentially an   occult hernia there. MUSCULOSKELETAL:  Symmetrical muscle tone appreciated in all four extremities.    SKIN: Skin turgor is normal. No pathologic skin lesions appreciated.  NEUROLOGIC:  Motor and sensation appear grossly normal.  Cranial nerves are grossly without defect. PSYCH:  Alert and oriented to person, place and time. Affect is appropriate for situation.  Data Reviewed I have personally reviewed what is currently available of the patient's imaging, recent labs and medical records.   Labs:     Latest Ref Rng & Units 02/16/2023   11:54 AM 12/31/2021    1:38 PM 11/09/2011    8:45 PM  CBC  WBC 4.0 - 10.5 K/uL 6.6  9.3  18.7   Hemoglobin 13.0 - 17.0 g/dL 13.5  15.5  15.6   Hematocrit 39.0 - 52.0 % 41.6  46.7  44.6    Platelets 150.0 - 400.0 K/uL 183.0  230  225       Latest Ref Rng & Units 02/16/2023   11:54 AM 12/31/2021    1:38 PM 12/22/2016    3:04 PM  CMP  Glucose 70 - 99 mg/dL 98  126  101   BUN 6 - 23 mg/dL 19  5  15   Creatinine 0.40 - 1.50 mg/dL 1.07  1.24  1.10   Sodium 135 - 145 mEq/L 139  144  133   Potassium 3.5 - 5.1 mEq/L 4.5  4.2  4.3   Chloride 96 - 112 mEq/L 100  104  99   CO2 19 - 32 mEq/L 31  31  28   Calcium 8.4 - 10.5 mg/dL 9.4  10.2  10.1   Total Protein 6.0 - 8.3 g/dL 7.1  8.2    Total Bilirubin 0.2 - 1.2 mg/dL 1.2  1.0    Alkaline Phos 39 - 117 U/L 87  104    AST 0 - 37 U/L 20  34    ALT 0 - 53 U/L 11  25        Imaging: Radiological images reviewed:   Within last 24 hrs: No results found.  Assessment    Right inguinal hernia, possible bilateral. 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  I discussed possibility of incarceration, strangulation, enlargement in size over time, and the need for emergency surgery in the face of these.  Also reviewed the techniques of reduction should incarceration occur, and when unsuccessful to present to the ED.  Also discussed that surgery risks include recurrence which can be up to 30% in the case of  complex hernias, use of prosthetic materials (mesh) and the increased risk of infection and the possible need for re-operation and removal of mesh, possibility of post-op SBO or ileus, and the risks of general anesthetic including heart attack, stroke, sudden death or some reaction to anesthetic   medications. The patient, and those present, appear to understand the risks, any and all questions were answered to the patient's satisfaction.  No guarantees were ever expressed or implied.   Face-to-face time spent with the patient and accompanying care providers(if present) was 30 minutes, with more than 50% of the time spent counseling, educating, and coordinating care of the patient.    These notes generated with voice recognition software. I apologize for typographical errors.  Edwardine Deschepper M.D., FACS 02/23/2023, 10:44 AM     

## 2023-02-23 NOTE — Patient Instructions (Addendum)
You have chose to have your hernia repaired. This will be done by Dr. Claudine Mouton at [facility].  Please see your (blue) Pre-care information that you have been given today. Our surgery scheduler will call you to verify surgery date and to go over information.   You will need to arrange to be out of work for approximately 1-2 weeks and then you may return with a lifting restriction for 4 more weeks. If you have FMLA or Disability paperwork that needs to be filled out, please have your company fax your paperwork to [phone] or you may drop this by either office. This paperwork will be filled out within 3 days after your surgery has been completed.  You may have a bruise in your groin and also swelling and brusing in your testicle area. This is normal. You may use ice 4-5 times daily for 15-20 minutes each time. Make sure that you place a barrier between you and the ice pack. To decrease the swelling, you may roll up a bath towel and place it vertically in between your thighs with your testicles resting on the towel. You will want to keep this area elevated as much as possible for several days following surgery.    Inguinal Hernia, Adult Muscles help keep everything in the body in its proper place. But if a weak spot in the muscles develops, something can poke through. That is called a hernia. When this happens in the lower part of the belly (abdomen), it is called an inguinal hernia. (It takes its name from a part of the body in this region called the inguinal canal.) A weak spot in the wall of muscles lets some fat or part of the small intestine bulge through. An inguinal hernia can develop at any age. Men get them more often than women. CAUSES  In adults, an inguinal hernia develops over time. It can be triggered by: Suddenly straining the muscles of the lower abdomen. Lifting heavy objects. Straining to have a bowel movement. Difficult bowel movements (constipation) can lead to this. Constant  coughing. This may be caused by smoking or lung disease. Being overweight. Being pregnant. Working at a job that requires long periods of standing or heavy lifting. Having had an inguinal hernia before. One type can be an emergency situation. It is called a strangulated inguinal hernia. It develops if part of the small intestine slips through the weak spot and cannot get back into the abdomen. The blood supply can be cut off. If that happens, part of the intestine may die. This situation requires emergency surgery. SYMPTOMS  Often, a small inguinal hernia has no symptoms. It is found when a healthcare provider does a physical exam. Larger hernias usually have symptoms.  In adults, symptoms may include: A lump in the groin. This is easier to see when the person is standing. It might disappear when lying down. In men, a lump in the scrotum. Pain or burning in the groin. This occurs especially when lifting, straining or coughing. A dull ache or feeling of pressure in the groin. Signs of a strangulated hernia can include: A bulge in the groin that becomes very painful and tender to the touch. A bulge that turns red or purple. Fever, nausea and vomiting. Inability to have a bowel movement or to pass gas. DIAGNOSIS  To decide if you have an inguinal hernia, a healthcare provider will probably do a physical examination. This will include asking questions about any symptoms you have noticed. The healthcare  provider might feel the groin area and ask you to cough. If an inguinal hernia is felt, the healthcare provider may try to slide it back into the abdomen. Usually no other tests are needed. TREATMENT  Treatments can vary. The size of the hernia makes a difference. Options include: Watchful waiting. This is often suggested if the hernia is small and you have had no symptoms. No medical procedure will be done unless symptoms develop. You will need to watch closely for symptoms. If any occur,  contact your healthcare provider right away. Surgery. This is used if the hernia is larger or you have symptoms. Open surgery. This is usually an outpatient procedure (you will not stay overnight in a hospital). An cut (incision) is made through the skin in the groin. The hernia is put back inside the abdomen. The weak area in the muscles is then repaired by herniorrhaphy or hernioplasty. Herniorrhaphy: in this type of surgery, the weak muscles are sewn back together. Hernioplasty: a patch or mesh is used to close the weak area in the abdominal wall. Laparoscopy. In this procedure, a surgeon makes small incisions. A thin tube with a tiny video camera (called a laparoscope) is put into the abdomen. The surgeon repairs the hernia with mesh by looking with the video camera and using two long instruments. HOME CARE INSTRUCTIONS  After surgery to repair an inguinal hernia: You will need to take pain medicine prescribed by your healthcare provider. Follow all directions carefully. You will need to take care of the wound from the incision. Your activity will be restricted for awhile. This will probably include no heavy lifting for several weeks. You also should not do anything too active for a few weeks. When you can return to work will depend on the type of job that you have. During "watchful waiting" periods, you should: Maintain a healthy weight. Eat a diet high in fiber (fruits, vegetables and whole grains). Drink plenty of fluids to avoid constipation. This means drinking enough water and other liquids to keep your urine clear or pale yellow. Do not lift heavy objects. Do not stand for long periods of time. Quit smoking. This should keep you from developing a frequent cough. SEEK MEDICAL CARE IF:  A bulge develops in your groin area. You feel pain, a burning sensation or pressure in the groin. This might be worse if you are lifting or straining. You develop a fever of more than 100.5 F (38.1  C). SEEK IMMEDIATE MEDICAL CARE IF:  Pain in the groin increases suddenly. A bulge in the groin gets bigger suddenly and does not go down. For men, there is sudden pain in the scrotum. Or, the size of the scrotum increases. A bulge in the groin area becomes red or purple and is painful to touch. You have nausea or vomiting that does not go away. You feel your heart beating much faster than normal. You cannot have a bowel movement or pass gas. You develop a fever of more than 102.0 F (38.9 C).   This information is not intended to replace advice given to you by your health care provider. Make sure you discuss any questions you have with your health care provider.   Document Released: 01/31/2009 Document Revised: 12/07/2011 Document Reviewed: 03/18/2015 Elsevier Interactive Patient Education [employer]

## 2023-02-23 NOTE — Telephone Encounter (Signed)
Patient has been advised of Pre-Admission date/time, and Surgery date at [facility]  Surgery Date: 03/10/23 Preadmission Testing Date: 03/02/23 (phone 8a-1p)  Patient has been made aware to call [phone], between 1-3:00pm the day before surgery, to find out what time to arrive for surgery.

## 2023-03-02 ENCOUNTER — Encounter
Admission: RE | Admit: 2023-03-02 | Discharge: 2023-03-02 | Disposition: A | Discharge status: Home / Self Care | Payer: Medicaid Other | Source: Ambulatory Visit | Attending: Surgery | Admitting: Surgery

## 2023-03-02 ENCOUNTER — Other Ambulatory Visit: Payer: Self-pay

## 2023-03-02 DIAGNOSIS — I10 Essential (primary) hypertension: Secondary | ICD-10-CM

## 2023-03-02 DIAGNOSIS — Z01812 Encounter for preprocedural laboratory examination: Secondary | ICD-10-CM

## 2023-03-02 HISTORY — DX: Personal history of urinary calculi: Z87.442

## 2023-03-02 HISTORY — DX: Angina pectoris, unspecified: I20.9

## 2023-03-02 NOTE — Patient Instructions (Addendum)
Your procedure is scheduled on: 03/10/23 - Wednesday Report to the Registration Desk on the 1st floor of the [facility]. To find out your arrival time, please call [phone] between 1PM - 3PM on: 03/09/23 - Tuesday If your arrival time is 6:00 am, do not arrive before that time as the [facility] entrance doors do not open until 6:00 am.  REMEMBER: Instructions that are not followed completely may result in serious medical risk, up to and including death; or upon the discretion of your surgeon and anesthesiologist your surgery may need to be rescheduled.  Do not eat food or drink any liquids after midnight the night before surgery.  No gum chewing or hard candies.   One week prior to surgery: Stop Anti-inflammatories (NSAIDS) such as Advil, Aleve, Ibuprofen, Motrin, Naproxen, Naprosyn and Aspirin based products such as Excedrin, Goody's Powder, BC Powder.  Stop ANY OVER THE COUNTER supplements until after surgery.  You may take Tylenol if needed for pain up until the day of surgery.  TAKE ONLY THESE MEDICATIONS THE MORNING OF SURGERY WITH A SIP OF WATER:  NONE   No Alcohol for 24 hours before or after surgery.  No Smoking including e-cigarettes for 24 hours before surgery.  No chewable tobacco products for at least 6 hours before surgery.  No nicotine patches on the day of surgery.  Do not use any "recreational" drugs for at least a week (preferably 2 weeks) before your surgery.  Please be advised that the combination of cocaine and anesthesia may have negative outcomes, up to and including death. If you test positive for cocaine, your surgery will be cancelled.  On the morning of surgery brush your teeth with toothpaste and water, you may rinse your mouth with mouthwash if you wish. Do not swallow any toothpaste or mouthwash.  Use CHG Soap or wipes as directed on instruction sheet.  Do not wear jewelry, make-up, hairpins, clips or nail polish.  Do not wear lotions,  powders, or perfumes.   Do not shave body hair from the neck down 48 hours before surgery.  Contact lenses, hearing aids and dentures may not be worn into surgery.  Do not bring valuables to the hospital. [facility] is not responsible for any missing/lost belongings or valuables.   Notify your doctor if there is any change in your medical condition (cold, fever, infection).  Wear comfortable clothing (specific to your surgery type) to the hospital.  After surgery, you can help prevent lung complications by doing breathing exercises.  Take deep breaths and cough every 1-2 hours. Your doctor may order a device called an Incentive Spirometer to help you take deep breaths. When coughing or sneezing, hold a pillow firmly against your incision with both hands. This is called "splinting." Doing this helps protect your incision. It also decreases belly discomfort.  If you are being admitted to the hospital overnight, leave your suitcase in the car. After surgery it may be brought to your room.  In case of increased patient census, it may be necessary for you, the patient, to continue your postoperative care in the Same Day Surgery department.  If you are being discharged the day of surgery, you will not be allowed to drive home. You will need a responsible individual to drive you home and stay with you for 24 hours after surgery.   If you are taking public transportation, you will need to have a responsible individual with you.  Please call the Pre-admissions Testing Dept. at [phone] if you have any questions about these instructions.  Surgery Visitation Policy:  Patients having surgery or a procedure may have two visitors.  Children under the age of 42 must have an adult with them who is not the patient.  Inpatient Visitation:    Visiting hours are 7 a.m. to 8 p.m. Up to four visitors are allowed at one time in a patient room. The visitors may rotate out with other people during  the day.  One visitor age 47 or older may stay with the patient overnight and must be in the room by 8 p.m.    Preparing for Surgery with CHLORHEXIDINE GLUCONATE (CHG) Soap  Chlorhexidine Gluconate (CHG) Soap  o An antiseptic cleaner that kills germs and bonds with the skin to continue killing germs even after washing  o Used for showering the night before surgery and morning of surgery  Before surgery, you can play an important role by reducing the number of germs on your skin.  CHG (Chlorhexidine gluconate) soap is an antiseptic cleanser which kills germs and bonds with the skin to continue killing germs even after washing.  Please do not use if you have an allergy to CHG or antibacterial soaps. If your skin becomes reddened/irritated stop using the CHG.  1. Shower the NIGHT BEFORE SURGERY and the MORNING OF SURGERY with CHG soap.  2. If you choose to wash your hair, wash your hair first as usual with your normal shampoo.  3. After shampooing, rinse your hair and body thoroughly to remove the shampoo.  4. Use CHG as you would any other liquid soap. You can apply CHG directly to the skin and wash gently with a scrungie or a clean washcloth.  5. Apply the CHG soap to your body only from the neck down. Do not use on open wounds or open sores. Avoid contact with your eyes, ears, mouth, and genitals (private parts). Wash face and genitals (private parts) with your normal soap.  6. Wash thoroughly, paying special attention to the area where your surgery will be performed.  7. Thoroughly rinse your body with warm water.  8. Do not shower/wash with your normal soap after using and rinsing off the CHG soap.  9. Pat yourself dry with a clean towel.  10. Wear clean pajamas to bed the night before surgery.  12. Place clean sheets on your bed the night of your first shower and do not sleep with pets.  13. Shower again with the CHG soap on the day of surgery prior to arriving at the  hospital.  14. Do not apply any deodorants/lotions/powders.  15. Please wear clean clothes to the hospital.

## 2023-03-03 ENCOUNTER — Encounter
Admission: RE | Admit: 2023-03-03 | Discharge: 2023-03-03 | Disposition: A | Discharge status: Home / Self Care | Payer: Medicaid Other | Source: Ambulatory Visit | Attending: Surgery | Admitting: Surgery

## 2023-03-03 DIAGNOSIS — Z01810 Encounter for preprocedural cardiovascular examination: Secondary | ICD-10-CM | POA: Diagnosis present

## 2023-03-03 DIAGNOSIS — I10 Essential (primary) hypertension: Secondary | ICD-10-CM

## 2023-03-03 DIAGNOSIS — Z01812 Encounter for preprocedural laboratory examination: Secondary | ICD-10-CM

## 2023-03-10 ENCOUNTER — Other Ambulatory Visit: Payer: Self-pay

## 2023-03-10 ENCOUNTER — Ambulatory Visit: Payer: Medicaid Other | Admitting: Certified Registered"

## 2023-03-10 ENCOUNTER — Ambulatory Visit
Admission: RE | Admit: 2023-03-10 | Discharge: 2023-03-10 | Disposition: A | Discharge status: Home / Self Care | Payer: Medicaid Other | Attending: Surgery | Admitting: Surgery

## 2023-03-10 ENCOUNTER — Encounter: Payer: Self-pay | Admitting: Surgery

## 2023-03-10 ENCOUNTER — Ambulatory Visit: Payer: Medicaid Other | Admitting: Urgent Care

## 2023-03-10 ENCOUNTER — Encounter
Admission: RE | Disposition: A | Discharge status: Home / Self Care | Payer: Self-pay | Source: Home / Self Care | Attending: Surgery

## 2023-03-10 DIAGNOSIS — K4090 Unilateral inguinal hernia, without obstruction or gangrene, not specified as recurrent: Secondary | ICD-10-CM | POA: Diagnosis not present

## 2023-03-10 DIAGNOSIS — K5900 Constipation, unspecified: Secondary | ICD-10-CM | POA: Diagnosis not present

## 2023-03-10 SURGERY — HERNIORRHAPHY, INGUINAL, ROBOT-ASSISTED, LAPAROSCOPIC
Anesthesia: General | Site: Inguinal | Laterality: Right

## 2023-03-10 MED ORDER — GABAPENTIN 300 MG PO CAPS
ORAL_CAPSULE | ORAL | Status: AC
  Filled 2023-03-10: qty 1

## 2023-03-10 MED ORDER — CHLORHEXIDINE GLUCONATE 0.12 % MT SOLN
15.0000 mL | Freq: Once | OROMUCOSAL | Status: AC
  Administered 2023-03-10: 15 mL via OROMUCOSAL

## 2023-03-10 MED ORDER — ACETAMINOPHEN 500 MG PO TABS
ORAL_TABLET | ORAL | Status: AC
  Filled 2023-03-10: qty 2

## 2023-03-10 MED ORDER — BUPIVACAINE LIPOSOME 1.3 % IJ SUSP
INTRAMUSCULAR | Status: AC
  Filled 2023-03-10: qty 20

## 2023-03-10 MED ORDER — CEFAZOLIN SODIUM-DEXTROSE 2-4 GM/100ML-% IV SOLN
INTRAVENOUS | Status: AC
  Filled 2023-03-10: qty 100

## 2023-03-10 MED ORDER — MIDAZOLAM HCL 2 MG/2ML IJ SOLN
INTRAMUSCULAR | Status: DC | PRN
  Administered 2023-03-10: 2 mg via INTRAVENOUS

## 2023-03-10 MED ORDER — CHLORHEXIDINE GLUCONATE CLOTH 2 % EX PADS: 6.0000 | MEDICATED_PAD | Freq: Once | CUTANEOUS | Status: DC

## 2023-03-10 MED ORDER — ACETAMINOPHEN 500 MG PO TABS
1000.0000 mg | ORAL_TABLET | ORAL | Status: AC
  Administered 2023-03-10: 1000 mg via ORAL

## 2023-03-10 MED ORDER — HYDROMORPHONE HCL 1 MG/ML IJ SOLN: 0.2500 mg | INTRAMUSCULAR | Status: DC | PRN

## 2023-03-10 MED ORDER — LACTATED RINGERS IV SOLN: INTRAVENOUS | Status: DC

## 2023-03-10 MED ORDER — BUPIVACAINE-EPINEPHRINE (PF) 0.25% -1:200000 IJ SOLN
INTRAMUSCULAR | Status: DC | PRN
  Administered 2023-03-10: 30 mL

## 2023-03-10 MED ORDER — ORAL CARE MOUTH RINSE: 15.0000 mL | Freq: Once | OROMUCOSAL | Status: AC

## 2023-03-10 MED ORDER — SUGAMMADEX SODIUM 200 MG/2ML IV SOLN
INTRAVENOUS | Status: DC | PRN
  Administered 2023-03-10: 200 mg via INTRAVENOUS
  Administered 2023-03-10: 100 mg via INTRAVENOUS

## 2023-03-10 MED ORDER — PROPOFOL 10 MG/ML IV BOLUS
INTRAVENOUS | Status: AC
  Filled 2023-03-10: qty 20

## 2023-03-10 MED ORDER — CELECOXIB 200 MG PO CAPS
ORAL_CAPSULE | ORAL | Status: AC
  Filled 2023-03-10: qty 1

## 2023-03-10 MED ORDER — BUPIVACAINE LIPOSOME 1.3 % IJ SUSP: 20.0000 mL | Freq: Once | INTRAMUSCULAR | Status: DC

## 2023-03-10 MED ORDER — CELECOXIB 200 MG PO CAPS
200.0000 mg | ORAL_CAPSULE | ORAL | Status: AC
  Administered 2023-03-10: 200 mg via ORAL

## 2023-03-10 MED ORDER — LIDOCAINE HCL (CARDIAC) PF 100 MG/5ML IV SOSY
PREFILLED_SYRINGE | INTRAVENOUS | Status: DC | PRN
  Administered 2023-03-10: 80 mg via INTRAVENOUS

## 2023-03-10 MED ORDER — EPINEPHRINE PF 1 MG/ML IJ SOLN
INTRAMUSCULAR | Status: AC
  Filled 2023-03-10: qty 1

## 2023-03-10 MED ORDER — FENTANYL CITRATE (PF) 100 MCG/2ML IJ SOLN
INTRAMUSCULAR | Status: AC
  Filled 2023-03-10: qty 2

## 2023-03-10 MED ORDER — BUPIVACAINE HCL (PF) 0.25 % IJ SOLN
INTRAMUSCULAR | Status: AC
  Filled 2023-03-10: qty 30

## 2023-03-10 MED ORDER — FAMOTIDINE 20 MG PO TABS
20.0000 mg | ORAL_TABLET | Freq: Once | ORAL | Status: AC
  Administered 2023-03-10: 20 mg via ORAL

## 2023-03-10 MED ORDER — PROPOFOL 10 MG/ML IV BOLUS
INTRAVENOUS | Status: DC | PRN
  Administered 2023-03-10: 200 mg via INTRAVENOUS

## 2023-03-10 MED ORDER — PHENYLEPHRINE HCL-NACL 20-0.9 MG/250ML-% IV SOLN
INTRAVENOUS | Status: DC | PRN
  Administered 2023-03-10: 50 ug/min via INTRAVENOUS

## 2023-03-10 MED ORDER — GABAPENTIN 300 MG PO CAPS
300.0000 mg | ORAL_CAPSULE | ORAL | Status: AC
  Administered 2023-03-10: 300 mg via ORAL

## 2023-03-10 MED ORDER — DEXAMETHASONE SODIUM PHOSPHATE 10 MG/ML IJ SOLN
INTRAMUSCULAR | Status: DC | PRN
  Administered 2023-03-10: 10 mg via INTRAVENOUS

## 2023-03-10 MED ORDER — PHENYLEPHRINE HCL (PRESSORS) 10 MG/ML IV SOLN
INTRAVENOUS | Status: DC | PRN
  Administered 2023-03-10: 80 ug via INTRAVENOUS
  Administered 2023-03-10 (×2): 160 ug via INTRAVENOUS

## 2023-03-10 MED ORDER — FAMOTIDINE 20 MG PO TABS
ORAL_TABLET | ORAL | Status: AC
  Filled 2023-03-10: qty 1

## 2023-03-10 MED ORDER — CHLORHEXIDINE GLUCONATE 0.12 % MT SOLN
OROMUCOSAL | Status: AC
  Filled 2023-03-10: qty 15

## 2023-03-10 MED ORDER — OXYCODONE HCL 5 MG PO TABS
5.0000 mg | ORAL_TABLET | Freq: Once | ORAL | Status: AC | PRN
  Administered 2023-03-10: 5 mg via ORAL

## 2023-03-10 MED ORDER — OXYCODONE HCL 5 MG PO TABS
ORAL_TABLET | ORAL | Status: AC
  Filled 2023-03-10: qty 1

## 2023-03-10 MED ORDER — HYDROCODONE-ACETAMINOPHEN 5-325 MG PO TABS
1.0000 | ORAL_TABLET | Freq: Four times a day (QID) | ORAL | 0 refills | Status: DC | PRN
Start: 2023-03-10 — End: 2023-03-25

## 2023-03-10 MED ORDER — FENTANYL CITRATE (PF) 100 MCG/2ML IJ SOLN
INTRAMUSCULAR | Status: DC | PRN
  Administered 2023-03-10: 25 ug via INTRAVENOUS
  Administered 2023-03-10: 75 ug via INTRAVENOUS

## 2023-03-10 MED ORDER — ROCURONIUM BROMIDE 100 MG/10ML IV SOLN
INTRAVENOUS | Status: DC | PRN
  Administered 2023-03-10: 60 mg via INTRAVENOUS

## 2023-03-10 MED ORDER — OXYCODONE HCL 5 MG/5ML PO SOLN: 5.0000 mg | Freq: Once | ORAL | Status: AC | PRN

## 2023-03-10 MED ORDER — CHLORHEXIDINE GLUCONATE CLOTH 2 % EX PADS
6.0000 | MEDICATED_PAD | Freq: Once | CUTANEOUS | Status: AC
  Administered 2023-03-10: 6 via TOPICAL

## 2023-03-10 MED ORDER — MIDAZOLAM HCL 2 MG/2ML IJ SOLN
INTRAMUSCULAR | Status: AC
  Filled 2023-03-10: qty 2

## 2023-03-10 MED ORDER — ONDANSETRON HCL 4 MG/2ML IJ SOLN
INTRAMUSCULAR | Status: DC | PRN
  Administered 2023-03-10: 4 mg via INTRAVENOUS

## 2023-03-10 MED ORDER — EPHEDRINE SULFATE (PRESSORS) 50 MG/ML IJ SOLN
INTRAMUSCULAR | Status: DC | PRN
  Administered 2023-03-10: 10 mg via INTRAVENOUS

## 2023-03-10 MED ORDER — CEFAZOLIN SODIUM-DEXTROSE 2-4 GM/100ML-% IV SOLN
2.0000 g | INTRAVENOUS | Status: AC
  Administered 2023-03-10: 2 g via INTRAVENOUS

## 2023-03-10 SURGICAL SUPPLY — 48 items
ADH SKN CLS APL DERMABOND .7 (GAUZE/BANDAGES/DRESSINGS) ×1
BLADE CLIPPER SURG (BLADE) ×1 IMPLANT
COVER TIP SHEARS 8 DVNC (MISCELLANEOUS) ×1 IMPLANT
COVER WAND RF STERILE (DRAPES) ×1 IMPLANT
DERMABOND ADVANCED .7 DNX12 (GAUZE/BANDAGES/DRESSINGS) ×1 IMPLANT
DRAPE ARM DVNC X/XI (DISPOSABLE) ×3 IMPLANT
DRAPE COLUMN DVNC XI (DISPOSABLE) ×1 IMPLANT
DRAPE UTILITY 15X26 TOWEL STRL (DRAPES) ×1 IMPLANT
ELECT REM PT RETURN 9FT ADLT (ELECTROSURGICAL) ×1
ELECTRODE REM PT RTRN 9FT ADLT (ELECTROSURGICAL) ×1 IMPLANT
FORCEPS BPLR R/ABLATION 8 DVNC (INSTRUMENTS) ×1 IMPLANT
GLOVE ORTHO TXT STRL SZ7.5 (GLOVE) ×3 IMPLANT
GOWN STRL REUS W/ TWL LRG LVL3 (GOWN DISPOSABLE) ×1 IMPLANT
GOWN STRL REUS W/ TWL XL LVL3 (GOWN DISPOSABLE) ×2 IMPLANT
GOWN STRL REUS W/TWL LRG LVL3 (GOWN DISPOSABLE) ×1
GOWN STRL REUS W/TWL XL LVL3 (GOWN DISPOSABLE) ×2
GRASPER SUT TROCAR 14GX15 (MISCELLANEOUS) IMPLANT
IRRIGATION STRYKERFLOW (MISCELLANEOUS) IMPLANT
IRRIGATOR STRYKERFLOW (MISCELLANEOUS)
IV NS 1000ML (IV SOLUTION)
IV NS 1000ML BAXH (IV SOLUTION) IMPLANT
KIT PINK PAD W/HEAD ARE REST (MISCELLANEOUS) ×1
KIT PINK PAD W/HEAD ARM REST (MISCELLANEOUS) ×1 IMPLANT
LABEL OR SOLS (LABEL) ×1 IMPLANT
MANIFOLD NEPTUNE II (INSTRUMENTS) ×1 IMPLANT
MESH 3DMAX LIGHT 4.8X6.7 RT XL (Mesh General) IMPLANT
NDL DRIVE SUT CUT DVNC (INSTRUMENTS) ×1 IMPLANT
NDL HYPO 22X1.5 SAFETY MO (MISCELLANEOUS) ×1 IMPLANT
NDL INSUFFLATION 14GA 120MM (NEEDLE) IMPLANT
NEEDLE DRIVE SUT CUT DVNC (INSTRUMENTS) ×1 IMPLANT
NEEDLE HYPO 22X1.5 SAFETY MO (MISCELLANEOUS) ×1 IMPLANT
NEEDLE INSUFFLATION 14GA 120MM (NEEDLE) ×1 IMPLANT
PACK LAP CHOLECYSTECTOMY (MISCELLANEOUS) ×1 IMPLANT
SCISSORS MNPLR CVD DVNC XI (INSTRUMENTS) ×1 IMPLANT
SEAL UNIV 5-12 XI (MISCELLANEOUS) ×3 IMPLANT
SET TUBE SMOKE EVAC HIGH FLOW (TUBING) ×1 IMPLANT
SOL ELECTROSURG ANTI STICK (MISCELLANEOUS) ×1
SOLUTION ELECTROSURG ANTI STCK (MISCELLANEOUS) ×1 IMPLANT
SUT MNCRL 4-0 (SUTURE) ×1
SUT MNCRL 4-0 27XMFL (SUTURE) ×1
SUT V-LOC 90 ABS 3-0 VLT V-20 (SUTURE) IMPLANT
SUT VIC AB 2-0 SH 27 (SUTURE) ×1
SUT VIC AB 2-0 SH 27XBRD (SUTURE) ×1 IMPLANT
SUT VIC AB 3-0 SH 27 (SUTURE)
SUT VIC AB 3-0 SH 27X BRD (SUTURE) IMPLANT
SUT VICRYL 0 UR6 27IN ABS (SUTURE) IMPLANT
SUT VLOC 90 S/L VL9 GS22 (SUTURE) IMPLANT
SUTURE MNCRL 4-0 27XMF (SUTURE) ×1 IMPLANT

## 2023-03-10 NOTE — Anesthesia Postprocedure Evaluation (Signed)
Anesthesia Post Note  Patient: CHARELS STAMBAUGH  Procedure(s) Performed: XI ROBOTIC ASSISTED INGUINAL HERNIA, possible bilateral (Right: Inguinal)  Patient location during evaluation: PACU Anesthesia Type: General Level of consciousness: awake and alert Pain management: pain level controlled Vital Signs Assessment: post-procedure vital signs reviewed and stable Respiratory status: spontaneous breathing, nonlabored ventilation, respiratory function stable and patient connected to nasal cannula oxygen Cardiovascular status: blood pressure returned to baseline and stable Postop Assessment: no apparent nausea or vomiting Anesthetic complications: no   No notable events documented.   Last Vitals:  Vitals:   03/10/23 0945 03/10/23 0947  BP: (!) 147/109 (!) 149/98  Pulse: 77 72  Resp: 16   Temp: (!) 36.2 C   SpO2: 99% 100%    Last Pain:  Vitals:   03/10/23 0945  TempSrc: Temporal  PainSc:                  Louie Boston

## 2023-03-10 NOTE — Anesthesia Preprocedure Evaluation (Addendum)
Anesthesia Evaluation  Patient identified by MRN, date of birth, ID band Patient awake    Reviewed: Allergy & Precautions, NPO status , Patient's Chart, lab work & pertinent test results  History of Anesthesia Complications Negative for: history of anesthetic complications  Airway Mallampati: I  TM Distance: >3 FB Neck ROM: full    Dental  (+) Edentulous Upper, Edentulous Lower   Pulmonary former smoker   Pulmonary exam normal        Cardiovascular hypertension, On Medications (-) angina (-) DOE Normal cardiovascular exam  EKG Normal sinus rhythm Moderate voltage criteria for LVH, may be normal variant ( Sokolow-Lyon , Cornell product ) Nonspecific T wave abnormalit   Neuro/Psych negative neurological ROS  negative psych ROS   GI/Hepatic negative GI ROS, Neg liver ROS,,,  Endo/Other  negative endocrine ROS    Renal/GU      Musculoskeletal   Abdominal   Peds  Hematology negative hematology ROS (+)   Anesthesia Other Findings Past Medical History: No date: Anginal pain (HCC) No date: Chest pain No date: Essential hypertension No date: History of kidney stones  Past Surgical History: No date: CERVICAL DISC SURGERY No date: DG GALL BLADDER No date: NECK SURGERY 2008: NECK SURGERY     Comment:  [place]  BMI    Body Mass Index: 25.40 kg/m      Reproductive/Obstetrics negative OB ROS                             Anesthesia Physical Anesthesia Plan  ASA: 2  Anesthesia Plan: General ETT   Post-op Pain Management: Tylenol PO (pre-op)*, Gabapentin PO (pre-op)*, Celebrex PO (pre-op)* and Ketamine IV*   Induction: Intravenous  PONV Risk Score and Plan: 3 and Ondansetron, Dexamethasone, Midazolam and Treatment may vary due to age or medical condition  Airway Management Planned: Oral ETT  Additional Equipment:   Intra-op Plan:   Post-operative Plan: Extubation in  OR  Informed Consent: I have reviewed the patients History and Physical, chart, labs and discussed the procedure including the risks, benefits and alternatives for the proposed anesthesia with the patient or authorized representative who has indicated his/her understanding and acceptance.     Dental Advisory Given  Plan Discussed with: Anesthesiologist, CRNA and Surgeon  Anesthesia Plan Comments: (Patient consented for risks of anesthesia including but not limited to:  - adverse reactions to medications - damage to eyes, teeth, lips or other oral mucosa - nerve damage due to positioning  - sore throat or hoarseness - Damage to heart, brain, nerves, lungs, other parts of body or loss of life  Patient voiced understanding.)        Anesthesia Quick Evaluation

## 2023-03-10 NOTE — Anesthesia Procedure Notes (Signed)
Procedure Name: Intubation Date/Time: 03/10/2023 7:38 AM  Performed by: Danelle Berry, CRNAPre-anesthesia Checklist: Patient identified, Emergency Drugs available, Suction available and Patient being monitored Patient Re-evaluated:Patient Re-evaluated prior to induction Oxygen Delivery Method: Circle system utilized Preoxygenation: Pre-oxygenation with 100% oxygen Induction Type: IV induction Ventilation: Mask ventilation without difficulty Laryngoscope Size: McGraph and 3 Grade View: Grade I Tube type: Oral Tube size: 7.5 mm Number of attempts: 1 Airway Equipment and Method: Stylet and Oral airway Placement Confirmation: ETT inserted through vocal cords under direct vision, positive ETCO2 and breath sounds checked- equal and bilateral Tube secured with: Tape Dental Injury: Teeth and Oropharynx as per pre-operative assessment

## 2023-03-10 NOTE — Op Note (Signed)
Robotic assisted Laparoscopic Transabdominal Right Inguinal Hernia Repair with Mesh       Pre-operative Diagnosis:  Right  Inguinal Hernia   Post-operative Diagnosis: Same   Procedure: Robotic assisted Laparoscopic  repair of Right inguinal hernia(s)   Surgeon: [name], M.D., FACS   Anesthesia: GETA   Findings: right inguinal hernia, no  evidence of left sided hernia.         Procedure Details  The patient was seen again in the Holding Room. The benefits, complications, treatment options, and expected outcomes were discussed with the patient. The risks of bleeding, infection, recurrence of symptoms, failure to resolve symptoms, recurrence of hernia, ischemic orchitis, chronic pain syndrome or neuroma, were reviewed again. The likelihood of improving the patient's symptoms with return to their baseline status is good.  The patient and/or family concurred with the proposed plan, giving informed consent.  The patient was taken to Operating Room, identified  and the procedure verified as Laparoscopic Inguinal Hernia Repair. Laterality confirmed.  A Time Out was held and the above information confirmed.   Prior to the induction of general anesthesia, antibiotic prophylaxis was administered. VTE prophylaxis was in place. General endotracheal anesthesia was then administered and tolerated well. After the induction, the abdomen was prepped with Chloraprep and draped in the sterile fashion. The patient was positioned in the supine position.   After local infiltration of quarter percent Marcaine with epinephrine, stab incision was made left upper quadrant.  On the left at Palmer's point, the Veress needle is passed with sensation of the layers to penetrate the abdominal wall and into the peritoneum.  Saline drop test is confirmed peritoneal placement.  Insufflation is initiated with carbon dioxide to pressures of 15 mmHg. An 8.5 mm port is placed to the left off of the midline, with blunt tipped  trocar.  Pneumoperitoneum maintained w/o HD changes to pressures of 15 mm Hg with CO2. No evidence of bowel injuries.  Two 8.5 mm ports placed under direct vision in each upper quadrant. The laparoscopy revealed a right indirect defect(s).   The robot was brought ot the table and docked in the standard fashion, no collision between arms was observed. Instruments were kept under direct view at all times. For right inguinal hernia repair,  I developed a peritoneal flap. The sac(s) were reduced and dissected free from adjacent structures. We preserved the vas and the vessels, and visualized them to their convergence and beyond in the retroperitoneum. Once dissection was completed an extra-large right sided BARD 3D Light mesh was placed and secured at three points with interrupted 2-0 Vicryl to the pubic tubercle and anteriorly. There was good coverage of the direct, indirect and femoral spaces. Second look revealed no complications or injuries. The flap was then closed with 3-0 V-lock suture.  Peritoneal closure without defects.  Once assuring that hemostasis was adequate, all needles/sponges removed, and the robot was undocked.  Under direct visualization I placed the Veress needle into the preperitoneal space the Veress' valve was released allowing extraperitoneal CO2 to escape.  The ports were removed, the abdomen desulflated.  4-0 subcuticular Monocryl was used at all skin edges. Dermabond was placed.  Patient tolerated the procedure well. There were no complications. He was taken to the recovery room in stable condition.           [name], M.D., FACS 03/10/2023, 9:19 AM

## 2023-03-10 NOTE — Transfer of Care (Signed)
Immediate Anesthesia Transfer of Care Note  Patient: Benjamin Hardy  Procedure(s) Performed: XI ROBOTIC ASSISTED INGUINAL HERNIA, possible bilateral (Right: Inguinal)  Patient Location: PACU  Anesthesia Type:General  Level of Consciousness: awake  Airway & Oxygen Therapy: Patient Spontanous Breathing  Post-op Assessment: Report given to RN and Post -op Vital signs reviewed and stable  Post vital signs: Reviewed and stable  Last Vitals:  Vitals Value Taken Time  BP 144/88 03/10/23 0915  Temp 37.1 C 03/10/23 0908  Pulse 76 03/10/23 0919  Resp 17 03/10/23 0919  SpO2 98 % 03/10/23 0919  Vitals shown include unvalidated device data.  Last Pain:  Vitals:   03/10/23 0908  TempSrc:   PainSc: Asleep         Complications: No notable events documented.

## 2023-03-10 NOTE — Discharge Instructions (Signed)

## 2023-03-10 NOTE — Interval H&P Note (Signed)
History and Physical Interval Note:  03/10/2023 7:19 AM  Benjamin Hardy  has presented today for surgery, with the diagnosis of inguinal hernia.  The various methods of treatment have been discussed with the patient and family. After consideration of risks, benefits and other options for treatment, the patient has consented to  Procedure(s): XI ROBOTIC ASSISTED INGUINAL HERNIA, possible bilateral (Right) as a surgical intervention.  The patient's history has been reviewed, patient examined, no change in status, stable for surgery.  I have reviewed the patient's chart and labs.  Questions were answered to the patient's satisfaction.    The right side is marked.   [name]

## 2023-03-19 ENCOUNTER — Ambulatory Visit (INDEPENDENT_AMBULATORY_CARE_PROVIDER_SITE_OTHER): Payer: Medicaid Other | Admitting: Nurse Practitioner

## 2023-03-19 ENCOUNTER — Encounter: Payer: Self-pay | Admitting: Nurse Practitioner

## 2023-03-19 VITALS — BP 154/98 | HR 80 | Temp 98.5°F | Resp 16 | Ht 70.0 in | Wt 182.2 lb

## 2023-03-19 DIAGNOSIS — I10 Essential (primary) hypertension: Secondary | ICD-10-CM

## 2023-03-19 LAB — BASIC METABOLIC PANEL
BUN: 15 mg/dL (ref 6–23)
CO2: 32 mEq/L (ref 19–32)
Calcium: 9.3 mg/dL (ref 8.4–10.5)
Chloride: 99 mEq/L (ref 96–112)
Creatinine, Ser: 1.18 mg/dL (ref 0.40–1.50)
GFR: 66.13 mL/min (ref >60.00)
Glucose, Bld: 122 mg/dL — ABNORMAL HIGH (ref 70–99)
Potassium: 4 mEq/L (ref 3.5–5.1)
Sodium: 138 mEq/L (ref 135–145)

## 2023-03-19 MED ORDER — OLMESARTAN MEDOXOMIL 40 MG PO TABS
40.0000 mg | ORAL_TABLET | Freq: Every day | ORAL | 1 refills | Status: AC
Start: 2023-03-19 — End: ?

## 2023-03-19 NOTE — Patient Instructions (Signed)
Nice to see you today I have increased the medication from 20mg  to 40mg  a day I want to see you in 3 months, sooner if you need me  If you have any side effects call the office before you stop taking the medication

## 2023-03-19 NOTE — Progress Notes (Signed)
   Established Patient Office Visit  Subjective   Patient ID: Benjamin Hardy, male    DOB: 1961/01/03  Age: 62 y.o. MRN: [id number]  Chief Complaint  Patient presents with   Hypertension      HTN: patient states that he use to be on lisnopril-hydrochlorothiazide 20-12.5mg  and it took all his motivatoin  He was started on omlesartan last office visit   States that he does have cuff that is a wrist cuff and states that 139 was the top number last time.  Patient has not had any adverse drug events with this medication currently.  Energy level is normal for him and sexual function is normal for him per his report.    Review of Systems  Constitutional:  Negative for chills and fever.  Respiratory:  Negative for shortness of breath.   Cardiovascular:  Negative for chest pain.  Neurological:  Negative for headaches.  Psychiatric/Behavioral:  Negative for hallucinations and suicidal ideas.       Objective:     BP (!) 154/98   Pulse 80   Temp 98.5 F (36.9 C)   Resp 16   Ht 5\' 10"  (1.778 m)   Wt 182 lb 4 oz (82.7 kg)   SpO2 96%   BMI 26.15 kg/m    Physical Exam Vitals and nursing note reviewed.  Constitutional:      Appearance: Normal appearance.  Cardiovascular:     Rate and Rhythm: Normal rate and regular rhythm.     Heart sounds: Normal heart sounds.  Pulmonary:     Effort: Pulmonary effort is normal.     Breath sounds: Normal breath sounds.  Neurological:     Mental Status: He is alert.      No results found for any visits on 03/19/23.    The 10-year ASCVD risk score (Arnett DK, et al., 2019) is: 15.7%    Assessment & Plan:   Problem List Items Addressed This Visit       Cardiovascular and Mediastinum   Essential hypertension - Primary    Patient was maintained on olmesartan 20 mg daily.  Blood pressure not within normal limits will increase to 40 mg daily.  New prescription sent in.  Patient warned me that if he has sexual dysfunction he will  stop medication altogether.  Encourage patient to call office if he has sexual dysfunction and we will decrease the olmesartan from 40 mg to 20 mg and consider alternative therapy.  Patient is also been on Norvasc in the past and seems to tolerate it well per his report      Relevant Medications   olmesartan (BENICAR) 40 MG tablet   Other Relevant Orders   Basic metabolic panel    Return in about 3 months (around 06/19/2023) for BP recheck.    Audria Nine, NP

## 2023-03-19 NOTE — Assessment & Plan Note (Signed)
Patient was maintained on olmesartan 20 mg daily.  Blood pressure not within normal limits will increase to 40 mg daily.  New prescription sent in.  Patient warned me that if he has sexual dysfunction he will stop medication altogether.  Encourage patient to call office if he has sexual dysfunction and we will decrease the olmesartan from 40 mg to 20 mg and consider alternative therapy.  Patient is also been on Norvasc in the past and seems to tolerate it well per his report

## 2023-03-25 ENCOUNTER — Encounter: Payer: Self-pay | Admitting: Physician Assistant

## 2023-03-25 ENCOUNTER — Ambulatory Visit (INDEPENDENT_AMBULATORY_CARE_PROVIDER_SITE_OTHER): Payer: Medicaid Other | Admitting: Physician Assistant

## 2023-03-25 VITALS — BP 176/94 | HR 81 | Temp 98.8°F | Ht 72.0 in | Wt 182.2 lb

## 2023-03-25 DIAGNOSIS — K4090 Unilateral inguinal hernia, without obstruction or gangrene, not specified as recurrent: Secondary | ICD-10-CM

## 2023-03-25 DIAGNOSIS — Z09 Encounter for follow-up examination after completed treatment for conditions other than malignant neoplasm: Secondary | ICD-10-CM

## 2023-03-25 NOTE — Patient Instructions (Signed)

## 2023-03-25 NOTE — Progress Notes (Signed)
[facility] POST-OP OFFICE VISIT  03/25/2023  HPI: Benjamin Hardy is a 62 y.o. male 15 days s/p robotic assisted laparoscopic right inguinal hernia repair with Dr Claudine Mouton   He is overall doing well Right groin soreness; only needed three dose of narcotic pain medication No fever, chills, nausea, emesis Tolerating PO; no issues with bowels Incisions well healed No other complaints   Vital signs: BP (!) 176/94   Pulse 81   Temp 98.8 F (37.1 C) (Oral)   Ht 6' (1.829 m)   Wt 182 lb 3.2 oz (82.6 kg)   SpO2 95%   BMI 24.71 kg/m    Physical Exam: Constitutional: Well appearing male, NAD Abdomen: Soft, non-tender, non-distended, no rebound/guarding Skin: Laparoscopic incisions are healing well, no erythema or drainage   Assessment/Plan: This is a 62 y.o. male 15 days s/p robotic assisted laparoscopic right inguinal hernia repair with Dr Claudine Mouton    - Pain control prn  - Reviewed wound care recommendation  - Reviewed lifting restrictions; 4-6 weeks total  - He can follow up on as needed basis; He understands to call with questions/concerns  -- Lynden Oxford, PA-C  [facility] 03/25/2023, 1:49 PM M-F: 7am - 4pm

## 2023-04-05 LAB — COLOGUARD: COLOGUARD: NEGATIVE

## 2023-05-13 ENCOUNTER — Encounter (INDEPENDENT_AMBULATORY_CARE_PROVIDER_SITE_OTHER): Payer: Self-pay

## 2023-06-25 ENCOUNTER — Ambulatory Visit: Payer: MEDICAID | Admitting: Nurse Practitioner

## 2023-06-28 ENCOUNTER — Encounter: Payer: Self-pay | Admitting: Nurse Practitioner
